# Patient Record
Sex: Male | Born: 1970 | Race: Asian | Hispanic: No | Marital: Married | Smoking: Never smoker
Health system: Southern US, Community
[De-identification: ages and names within clinical notes are randomized; demographics above are authoritative.]

## PROBLEM LIST (undated history)

## (undated) DIAGNOSIS — M109 Gout, unspecified: Secondary | ICD-10-CM

---

## 2008-11-03 ENCOUNTER — Emergency Department (HOSPITAL_COMMUNITY): Admission: EM | Admit: 2008-11-03 | Discharge: 2008-11-03 | Payer: Self-pay | Admitting: Family Medicine

## 2014-11-20 ENCOUNTER — Emergency Department (INDEPENDENT_AMBULATORY_CARE_PROVIDER_SITE_OTHER)
Admission: EM | Admit: 2014-11-20 | Discharge: 2014-11-20 | Disposition: A | Discharge status: Home / Self Care | Payer: Self-pay | Source: Home / Self Care | Attending: Family Medicine | Admitting: Family Medicine

## 2014-11-20 ENCOUNTER — Encounter (HOSPITAL_COMMUNITY): Payer: Self-pay | Admitting: Emergency Medicine

## 2014-11-20 DIAGNOSIS — J069 Acute upper respiratory infection, unspecified: Secondary | ICD-10-CM

## 2014-11-20 DIAGNOSIS — B9789 Other viral agents as the cause of diseases classified elsewhere: Principal | ICD-10-CM

## 2014-11-20 MED ORDER — IPRATROPIUM BROMIDE 0.06 % NA SOLN: 2.0000 | Freq: Four times a day (QID) | NASAL | Status: DC

## 2014-11-20 MED ORDER — IBUPROFEN 600 MG PO TABS: 600.0000 mg | ORAL_TABLET | Freq: Four times a day (QID) | ORAL | Status: DC | PRN

## 2014-11-20 MED ORDER — GUAIFENESIN-CODEINE 100-10 MG/5ML PO SOLN: 5.0000 mL | Freq: Four times a day (QID) | ORAL | Status: DC | PRN

## 2014-11-20 NOTE — Discharge Instructions (Signed)
Please take your ibuprofen and use the nasal atrovent for your symptoms Please use the cough medicine to help you sleep at night THis will take another 1-4 days to improve

## 2014-11-20 NOTE — ED Provider Notes (Signed)
CSN: [id number]     Arrival date & time 11/20/14  1638 History   First MD Initiated Contact with Patient 11/20/14 1142     Chief Complaint  Patient presents with  . Cough   (Consider location/radiation/quality/duration/timing/severity/associated sxs/prior Treatment) HPI   Cough adn HA: started 3 days ago. Associated w/ runny nose and intermittent chills. Tylenol w/o benefit. Symptoms are worse at night. No aggravating factors. Overall symptoms are getting worse. Denies sore throat, chest pain, palpitations, shortness of breath, nausea, vomiting, diarrhea, constipation, abdominal pain, back pain.  History reviewed. No pertinent past medical history. History reviewed. No pertinent past surgical history. Family History  Problem Relation Age of Onset  . Diabetes Neg Hx   . Heart failure Neg Hx   . Cancer Neg Hx   . Hyperlipidemia Neg Hx   . Hypertension Neg Hx    History  Substance Use Topics  . Smoking status: Never Smoker   . Smokeless tobacco: Not on file  . Alcohol Use: Yes     Comment: occasional    Review of Systems Per HPI with all other pertinent systems negative.   Allergies  Review of patient's allergies indicates no known allergies.  Home Medications   Prior to Admission medications   Not on File   BP 117/82 mmHg  Pulse 100  Temp(Src) 98.3 F (36.8 C) (Oral)  Resp 20  SpO2 97% Physical Exam Physical Exam  Constitutional: oriented to person, place, and time. appears well-developed and well-nourished. No distress.  HENT:  Head: Normocephalic and atraumatic.  Eyes: EOMI. PERRL.  Neck: Normal range of motion.  Cardiovascular: RRR, no m/r/g, 2+ distal pulses,  Pulmonary/Chest: Effort normal and breath sounds normal. No respiratory distress.  Abdominal: Soft. Bowel sounds are normal. NonTTP, no distension.  Musculoskeletal: Normal range of motion. Non ttp, no effusion.  Neurological: alert and oriented to person, place, and time.  Skin: Skin is warm. No  rash noted. non diaphoretic.  Psychiatric: normal mood and affect. behavior is normal. Judgment and thought content normal.   ED Course  Procedures (including critical care time) Labs Review Labs Reviewed - No data to display  Imaging Review No results found.   MDM   1. Viral URI with cough    Nasal atrovent, ibuprofen, robitussin AC Precautions given adn all questions answered  Linna Darner, MD Family Medicine 11/20/2014, 11:51 AM      Waldemar Dickens, MD 11/20/14 1151

## 2014-11-20 NOTE — ED Notes (Signed)
Patient c/o cough and headache x 3 days. Patient reports he has been taking Tylenol with no relief. He is unsure of fever, but has felt warm. Patient is in NAD.

## 2017-03-28 ENCOUNTER — Encounter (HOSPITAL_COMMUNITY): Payer: Self-pay | Admitting: Emergency Medicine

## 2017-03-28 ENCOUNTER — Ambulatory Visit (HOSPITAL_COMMUNITY)
Admission: EM | Admit: 2017-03-28 | Discharge: 2017-03-28 | Disposition: A | Discharge status: Home / Self Care | Payer: Medicaid Other | Attending: Emergency Medicine | Admitting: Emergency Medicine

## 2017-03-28 DIAGNOSIS — R066 Hiccough: Secondary | ICD-10-CM

## 2017-03-28 MED ORDER — CHLORPROMAZINE HCL 25 MG PO TABS: 25.0000 mg | ORAL_TABLET | Freq: Three times a day (TID) | ORAL | 0 refills | Status: DC

## 2017-03-28 NOTE — ED Provider Notes (Signed)
  [place]   [id number] 03/28/17 Arrival Time: 1627   SUBJECTIVE:  Jebediah Macrae is a 46 y.o. male who presents to the urgent care with complaint of hiccups that it been constant for 2 weeks, and interfering with sleep. States he quite painful and uncomfortable. He is on no new medications, and has not had symptoms like this before. He has no chest pain or palpitations, no weakness or dizziness, nausea or vomiting. Does not smoke or drink, and history is otherwise negative  ROS: As per HPI, remainder of ROS negative.   OBJECTIVE:  Vitals:   03/28/17 1723  BP: (!) 135/95  Pulse: 80  Resp: 16  Temp: 98.3 F (36.8 C)  TempSrc: Oral  SpO2: 97%  Weight: 165 lb (74.8 kg)     General appearance: alert; no distress HEENT: normocephalic; atraumatic; conjunctivae normal; Oropharynx clear without erythema or edema, no tonsillar exudate Neck: Trachea midline without JVD or cervical lymphadenopathy Lungs: clear to auscultation bilaterally Heart: regular rate and rhythm Abdomen: soft, non-tender; bowel sounds normal; no masses or organomegaly; no guarding or rebound tenderness Musculoskeletal: Grossly symmetrical Skin: warm and dry Neurologic: Grossly normal Psychological:  alert and cooperative; normal mood and affect     ASSESSMENT & PLAN:  1. Intractable hiccups     Meds ordered this encounter  Medications  . chlorproMAZINE (THORAZINE) 25 MG tablet    Sig: Take 1 tablet (25 mg total) by mouth 3 (three) times daily. May increase to 2 tablets, by mouth, 3 times a day after 3 days if no relief    Dispense:  20 tablet    Refill:  0    Order Specific Question:   Supervising Provider    Answer:   [place], [place]   Recommend following up with community health and wellness to establish for primary care, given counseling regarding the diagnosis and medications. Return as needed or go to the ER if symptoms worsen  Reviewed expectations re: course of current  medical issues. Questions answered. Outlined signs and symptoms indicating need for more acute intervention. Patient verbalized understanding. After Visit Summary given.    Procedures:     No results found for this or any previous visit.  Labs Reviewed - No data to display  No results found.  No Known Allergies  PMHx, SurgHx, SocialHx, Medications, and Allergies were reviewed in the Visit Navigator and updated as appropriate.       Barnet Glasgow, NP 03/28/17 [phone]

## 2017-03-28 NOTE — ED Triage Notes (Signed)
PT reports hiccups for 2 weeks with no relief

## 2017-03-28 NOTE — Discharge Instructions (Signed)
I have attached a handout with regard to your diagnosis, and started you on a medicine called Thorazine. Take one tablet 3 times a day, if you've had no relief in 3 days, you can increase this to 2 tablets 3 times a day. I have also included the contact information for primary care provider, contact them to set up an appointment to establish for primary care.

## 2017-06-05 ENCOUNTER — Ambulatory Visit (HOSPITAL_COMMUNITY)
Admission: EM | Admit: 2017-06-05 | Discharge: 2017-06-05 | Disposition: A | Discharge status: Home / Self Care | Payer: Medicaid Other | Attending: Family Medicine | Admitting: Family Medicine

## 2017-06-05 ENCOUNTER — Encounter (HOSPITAL_COMMUNITY): Payer: Self-pay | Admitting: Emergency Medicine

## 2017-06-05 DIAGNOSIS — R21 Rash and other nonspecific skin eruption: Secondary | ICD-10-CM | POA: Diagnosis not present

## 2017-06-05 DIAGNOSIS — L237 Allergic contact dermatitis due to plants, except food: Secondary | ICD-10-CM

## 2017-06-05 MED ORDER — PREDNISONE 50 MG PO TABS: ORAL_TABLET | ORAL | 0 refills | Status: DC

## 2017-06-05 MED ORDER — TRIAMCINOLONE ACETONIDE 40 MG/ML IJ SUSP
INTRAMUSCULAR | Status: AC
  Filled 2017-06-05: qty 1

## 2017-06-05 MED ORDER — TRIAMCINOLONE ACETONIDE 40 MG/ML IJ SUSP
40.0000 mg | Freq: Once | INTRAMUSCULAR | Status: AC
  Administered 2017-06-05: 40 mg via INTRAMUSCULAR

## 2017-06-05 MED ORDER — TRIAMCINOLONE ACETONIDE 0.1 % EX CREA: 1.0000 "application " | TOPICAL_CREAM | Freq: Two times a day (BID) | CUTANEOUS | 0 refills | Status: DC

## 2017-06-05 NOTE — ED Provider Notes (Signed)
[place]    CSN: [id number] Arrival date & time: 06/05/17  1001     History   Chief Complaint Chief Complaint  Patient presents with  . Rash    HPI Larry Jenkins is a 46 y.o. male.   46 year old male was working in the yard 3-4 days ago chopping brush cleaning brush and handling brush. This was followed by a rough red itchy rash to primarily the extremities and less to the torso.      History reviewed. No pertinent past medical history.  There are no active problems to display for this patient.   History reviewed. No pertinent surgical history.     Home Medications    Prior to Admission medications   Medication Sig Start Date End Date Taking? Authorizing Provider  chlorproMAZINE (THORAZINE) 25 MG tablet Take 1 tablet (25 mg total) by mouth 3 (three) times daily. May increase to 2 tablets, by mouth, 3 times a day after 3 days if no relief 03/28/17   Barnet Glasgow, NP  guaiFENesin-codeine 100-10 MG/5ML syrup Take 5-10 mLs by mouth every 6 (six) hours as needed for cough. 11/20/14   Waldemar Dickens, MD  ibuprofen (ADVIL,MOTRIN) 600 MG tablet Take 1 tablet (600 mg total) by mouth every 6 (six) hours as needed. 11/20/14   Waldemar Dickens, MD  ipratropium (ATROVENT) 0.06 % nasal spray Place 2 sprays into both nostrils 4 (four) times daily. 11/20/14   Waldemar Dickens, MD  predniSONE (DELTASONE) 50 MG tablet 1 tab po daily for 6 days. Take with food. 06/05/17   Janne Napoleon, NP  triamcinolone cream (KENALOG) 0.1 % Apply 1 application 2 (two) times daily topically. 06/05/17   Janne Napoleon, NP    Family History Family History  Problem Relation Age of Onset  . Diabetes Neg Hx   . Heart failure Neg Hx   . Cancer Neg Hx   . Hyperlipidemia Neg Hx   . Hypertension Neg Hx     Social History Social History   Tobacco Use  . Smoking status: Never Smoker  Substance Use Topics  . Alcohol use: Yes    Comment: occasional  . Drug use: No     Allergies     Patient has no known allergies.   Review of Systems Review of Systems  Constitutional: Negative.   HENT: Negative.   Respiratory: Negative.   Skin: Positive for rash.  All other systems reviewed and are negative.    Physical Exam Triage Vital Signs ED Triage Vitals [06/05/17 1038]  Enc Vitals Group     BP 130/90     Pulse Rate 95     Resp 18     Temp 98.8 F (37.1 C)     Temp Source Oral     SpO2 98 %     Weight      Height      Head Circumference      Peak Flow      Pain Score      Pain Loc      Pain Edu?      Excl. in [place]?    No data found.  Updated Vital Signs BP 130/90 (BP Location: Right Arm)   Pulse 95   Temp 98.8 F (37.1 C) (Oral)   Resp 18   SpO2 98%   Visual Acuity Right Eye Distance:   Left Eye Distance:   Bilateral Distance:    Right Eye Near:   Left Eye Near:  Bilateral Near:     Physical Exam  Constitutional: He is oriented to person, place, and time. He appears well-developed and well-nourished. No distress.  Eyes: EOM are normal.  Neck: Normal range of motion. Neck supple.  Cardiovascular: Normal rate.  Pulmonary/Chest: Effort normal. No respiratory distress.  Musculoskeletal: He exhibits no edema.  Neurological: He is alert and oriented to person, place, and time. He exhibits normal muscle tone.  Skin: Skin is warm and dry.  Red patchy papular and blotchy rash to the legs and upper extremities, lesser to the abdomen.  Psychiatric: He has a normal mood and affect.  Nursing note and vitals reviewed.    UC Treatments / Results  Labs (all labs ordered are listed, but only abnormal results are displayed) Labs Reviewed - No data to display  EKG  EKG Interpretation None       Radiology No results found.  Procedures Procedures (including critical care time)  Medications Ordered in UC Medications  triamcinolone acetonide (KENALOG-40) injection 40 mg (not administered)     Initial Impression / Assessment and Plan /  UC Course  I have reviewed the triage vital signs and the nursing notes.  Pertinent labs & imaging results that were available during my care of the patient were reviewed by me and considered in my medical decision making (see chart for details).       Final Clinical Impressions(s) / UC Diagnoses   Final diagnoses:  Allergic contact dermatitis due to plants, except food    New Prescriptions This SmartLink is deprecated. Use AVSMEDLIST instead to display the medication list for a patient.   Controlled Substance Prescriptions  Controlled Substance Registry consulted? Not Applicable   Janne Napoleon, NP 06/05/17 1127

## 2017-06-05 NOTE — ED Triage Notes (Signed)
Pt here for itchy rash from poison ivy; pt used OTC meds without success

## 2017-08-30 ENCOUNTER — Other Ambulatory Visit: Payer: Self-pay

## 2017-08-30 ENCOUNTER — Ambulatory Visit (HOSPITAL_COMMUNITY)
Admission: EM | Admit: 2017-08-30 | Discharge: 2017-08-30 | Disposition: A | Discharge status: Home / Self Care | Payer: Medicaid Other | Attending: Family Medicine | Admitting: Family Medicine

## 2017-08-30 ENCOUNTER — Ambulatory Visit (INDEPENDENT_AMBULATORY_CARE_PROVIDER_SITE_OTHER): Payer: Medicaid Other

## 2017-08-30 ENCOUNTER — Encounter (HOSPITAL_COMMUNITY): Payer: Self-pay | Admitting: Emergency Medicine

## 2017-08-30 DIAGNOSIS — M25511 Pain in right shoulder: Secondary | ICD-10-CM

## 2017-08-30 DIAGNOSIS — M7580 Other shoulder lesions, unspecified shoulder: Secondary | ICD-10-CM

## 2017-08-30 MED ORDER — KETOROLAC TROMETHAMINE 30 MG/ML IJ SOLN
30.0000 mg | Freq: Once | INTRAMUSCULAR | Status: AC
  Administered 2017-08-30: 30 mg via INTRAMUSCULAR

## 2017-08-30 MED ORDER — KETOROLAC TROMETHAMINE 30 MG/ML IJ SOLN
INTRAMUSCULAR | Status: AC
  Filled 2017-08-30: qty 1

## 2017-08-30 NOTE — ED Triage Notes (Signed)
Pt reports RUA pain for the last three days.  Pt is unable to lift his forearm with out help.  Pt appears to have swelling in the right hand.  He points to the deltoid area as the primary source of pain.  He denies any injury.

## 2017-08-31 NOTE — ED Provider Notes (Signed)
  [place]   [id number] 08/30/17 Arrival Time: 4270  ASSESSMENT & PLAN:  1. Acute pain of right shoulder   2. Rotator cuff tendinitis, unspecified laterality     Meds ordered this encounter  Medications  . ketorolac (TORADOL) 30 MG/ML injection 30 mg   Take Motrin 800mg  po tid  Reviewed expectations re: course of current medical issues. Questions answered. Outlined signs and symptoms indicating need for more acute intervention. Patient verbalized understanding. After Visit Summary given.   SUBJECTIVE: History from: patient. Larry Jenkins is a 47 y.o. male who presents with complaint of persistent right shoulder pain for 2 days.  He denies any trauma. Reports abrupt onset today. Described symptoms have gradually worsened since beginning.  ROS: As per HPI.   OBJECTIVE:  Vitals:   08/30/17 1749  BP: 132/82  Pulse: 80  Temp: 98 F (36.7 C)  TempSrc: Oral  SpO2: 98%    General appearance: alert; no distress Eyes: PERRLA; EOMI; conjunctiva normal HENT: normocephalic; atraumatic; TMs normal; nasal mucosa normal; oral mucosa normal Neck: supple  Lungs: clear to auscultation bilaterally Heart: regular rate and rhythm Abdomen: soft, non-tender; bowel sounds normal; no masses or organomegaly; no guarding or rebound tenderness Back: no CVA tenderness Extremities: Decreased ROM right shoulder with abduction, external, and internal rotation. Pain with internal and external rotation. Skin: warm and dry Neurologic: normal gait; normal symmetric reflexes Psychological: alert and cooperative; normal mood and affect  Labs: No results found for this or any previous visit. Labs Reviewed - No data to display  Imaging: Dg Shoulder Right  Result Date: 08/30/2017 CLINICAL DATA:  Acute pain, swelling EXAM: RIGHT SHOULDER - 2+ VIEW COMPARISON:  None. FINDINGS: Soft tissue calcification near the insertion of the rotator cuff may reflect chronic tendinitis. No fracture,  subluxation or dislocation. Joint spaces maintained. 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 No acute bony abnormality. Electronically Signed   By: Rolm Baptise M.D.   On: 08/30/2017 18:22    No Known Allergies  History reviewed. No pertinent past medical history. Social History   Socioeconomic History  . Marital status: Married    Spouse name: Not on file  . Number of children: Not on file  . Years of education: Not on file  . Highest education level: Not on file  Social Needs  . Financial resource strain: Not on file  . Food insecurity - worry: Not on file  . Food insecurity - inability: Not on file  . Transportation needs - medical: Not on file  . Transportation needs - non-medical: Not on file  Occupational History  . Not on file  Tobacco Use  . Smoking status: Never Smoker  . Smokeless tobacco: Never Used  Substance and Sexual Activity  . Alcohol use: Yes    Comment: occasional  . Drug use: No  . Sexual activity: Not on file  Other Topics Concern  . Not on file  Social History Narrative  . Not on file   Family History  Problem Relation Age of Onset  . Diabetes Neg Hx   . Heart failure Neg Hx   . Cancer Neg Hx   . Hyperlipidemia Neg Hx   . Hypertension Neg Hx    History reviewed. No pertinent surgical history.   Larry Jenkins, [place] 08/31/17 1142

## 2017-09-26 ENCOUNTER — Ambulatory Visit (HOSPITAL_COMMUNITY)
Admission: EM | Admit: 2017-09-26 | Discharge: 2017-09-26 | Disposition: A | Discharge status: Home / Self Care | Payer: Medicaid Other | Attending: Family Medicine | Admitting: Family Medicine

## 2017-09-26 ENCOUNTER — Encounter (HOSPITAL_COMMUNITY): Payer: Self-pay | Admitting: Emergency Medicine

## 2017-09-26 DIAGNOSIS — R51 Headache: Secondary | ICD-10-CM | POA: Insufficient documentation

## 2017-09-26 DIAGNOSIS — B9789 Other viral agents as the cause of diseases classified elsewhere: Secondary | ICD-10-CM

## 2017-09-26 DIAGNOSIS — R05 Cough: Secondary | ICD-10-CM | POA: Diagnosis present

## 2017-09-26 DIAGNOSIS — Z79899 Other long term (current) drug therapy: Secondary | ICD-10-CM | POA: Insufficient documentation

## 2017-09-26 DIAGNOSIS — J029 Acute pharyngitis, unspecified: Secondary | ICD-10-CM | POA: Diagnosis present

## 2017-09-26 DIAGNOSIS — J069 Acute upper respiratory infection, unspecified: Secondary | ICD-10-CM | POA: Diagnosis not present

## 2017-09-26 LAB — POCT RAPID STREP A: Streptococcus, Group A Screen (Direct): NEGATIVE

## 2017-09-26 MED ORDER — CETIRIZINE HCL 10 MG PO CAPS: 10.0000 mg | ORAL_CAPSULE | Freq: Every day | ORAL | 0 refills | Status: DC

## 2017-09-26 MED ORDER — BENZONATATE 200 MG PO CAPS: 200.0000 mg | ORAL_CAPSULE | Freq: Three times a day (TID) | ORAL | 0 refills | Status: AC | PRN

## 2017-09-26 MED ORDER — FLUTICASONE PROPIONATE 50 MCG/ACT NA SUSP: 1.0000 | Freq: Every day | NASAL | 0 refills | Status: DC

## 2017-09-26 MED ORDER — KETOROLAC TROMETHAMINE 60 MG/2ML IM SOLN
60.0000 mg | Freq: Once | INTRAMUSCULAR | Status: AC
  Administered 2017-09-26: 60 mg via INTRAMUSCULAR

## 2017-09-26 MED ORDER — KETOROLAC TROMETHAMINE 60 MG/2ML IM SOLN
INTRAMUSCULAR | Status: AC
  Filled 2017-09-26: qty 2

## 2017-09-26 MED ORDER — HYDROCODONE-HOMATROPINE 5-1.5 MG/5ML PO SYRP: 5.0000 mL | ORAL_SOLUTION | Freq: Four times a day (QID) | ORAL | 0 refills | Status: AC | PRN

## 2017-09-26 NOTE — ED Provider Notes (Addendum)
[place]    CSN: [id number] Arrival date & time: 09/26/17  1403     History   Chief Complaint Chief Complaint  Patient presents with  . Headache  . Cough    HPI Larry Jenkins is a 47 y.o. male no contributing past medical history, Patient is presenting with URI symptoms- congestion, cough, sore throat.  Patient also with headache.  Patient's main complaints are cough preventing him from sleeping. Symptoms have been going on for 3-4 days. Patient has tried Tylenol and Aleve for his headache, with minimal relief. Denies fever, nausea, vomiting, diarrhea. Denies shortness of breath and chest pain.  Denies history of smoking.   HPI  History reviewed. No pertinent past medical history.  There are no active problems to display for this patient.   History reviewed. No pertinent surgical history.     Home Medications    Prior to Admission medications   Medication Sig Start Date End Date Taking? Authorizing Provider  benzonatate (TESSALON) 200 MG capsule Take 1 capsule (200 mg total) by mouth 3 (three) times daily as needed for up to 7 days for cough. 09/26/17 10/03/17  Ryin Schillo C, PA-C  Cetirizine HCl 10 MG CAPS Take 1 capsule (10 mg total) by mouth daily for 10 days. 09/26/17 10/06/17  Dequavion Follette C, PA-C  chlorproMAZINE (THORAZINE) 25 MG tablet Take 1 tablet (25 mg total) by mouth 3 (three) times daily. May increase to 2 tablets, by mouth, 3 times a day after 3 days if no relief 03/28/17   Barnet Glasgow, NP  fluticasone [facility]) 50 MCG/ACT nasal spray Place 1-2 sprays into both nostrils daily. 09/26/17   Terrye Dombrosky C, PA-C  guaiFENesin-codeine 100-10 MG/5ML syrup Take 5-10 mLs by mouth every 6 (six) hours as needed for cough. 11/20/14   Waldemar Dickens, MD  HYDROcodone-homatropine [facility]) 5-1.5 MG/5ML syrup Take 5 mLs by mouth every 6 (six) hours as needed for up to 5 days for cough. 09/26/17 10/01/17  Eydan Chianese C, PA-C  ibuprofen (ADVIL,MOTRIN) 600  MG tablet Take 1 tablet (600 mg total) by mouth every 6 (six) hours as needed. 11/20/14   Waldemar Dickens, MD  ipratropium (ATROVENT) 0.06 % nasal spray Place 2 sprays into both nostrils 4 (four) times daily. 11/20/14   Waldemar Dickens, MD  triamcinolone cream (KENALOG) 0.1 % Apply 1 application 2 (two) times daily topically. 06/05/17   Janne Napoleon, NP    Family History Family History  Problem Relation Age of Onset  . Diabetes Neg Hx   . Heart failure Neg Hx   . Cancer Neg Hx   . Hyperlipidemia Neg Hx   . Hypertension Neg Hx     Social History Social History   Tobacco Use  . Smoking status: Never Smoker  . Smokeless tobacco: Never Used  Substance Use Topics  . Alcohol use: Yes    Comment: occasional  . Drug use: No     Allergies   Patient has no known allergies.   Review of Systems Review of Systems  Constitutional: Negative for activity change, appetite change, fatigue and fever.  HENT: Positive for congestion and rhinorrhea. Negative for ear pain, postnasal drip, sinus pressure and sore throat.   Eyes: Negative for pain and itching.  Respiratory: Positive for cough. Negative for shortness of breath.   Cardiovascular: Negative for chest pain.  Gastrointestinal: Negative for abdominal pain, diarrhea, nausea and vomiting.  Musculoskeletal: Negative for myalgias.  Skin: Negative for rash.  Neurological: Positive  for headaches. Negative for dizziness and light-headedness.     Physical Exam Triage Vital Signs ED Triage Vitals [09/26/17 1526]  Enc Vitals Group     BP 127/83     Pulse Rate 79     Resp 16     Temp 98.4 F (36.9 C)     Temp Source Oral     SpO2 99 %     Weight 165 lb (74.8 kg)     Height      Head Circumference      Peak Flow      Pain Score 8     Pain Loc      Pain Edu?      Excl. in [place]?    No data found.  Updated Vital Signs BP 127/83   Pulse 79   Temp 98.4 F (36.9 C) (Oral)   Resp 16   Wt 165 lb (74.8 kg)   SpO2 99%   Visual  Acuity Right Eye Distance:   Left Eye Distance:   Bilateral Distance:    Right Eye Near:   Left Eye Near:    Bilateral Near:     Physical Exam  Constitutional: He is oriented to person, place, and time. He appears well-developed and well-nourished.  HENT:  Head: Normocephalic and atraumatic.  Bilateral TMs nonerythematous, nasal mucosa erythematous with rhinorrhea present.  Posterior oropharynx erythematous, no tonsillar enlargement or exudate.  Eyes: Conjunctivae and EOM are normal. Pupils are equal, round, and reactive to light.  Neck: Neck supple.  Cardiovascular: Normal rate and regular rhythm.  No murmur heard. Pulmonary/Chest: Effort normal and breath sounds normal. No respiratory distress.  Breathing audibly at rest, CTA BL  Musculoskeletal: He exhibits no edema.  Neurological: He is alert and oriented to person, place, and time.  Skin: Skin is warm and dry.  Psychiatric: He has a normal mood and affect.  Nursing note and vitals reviewed.    UC Treatments / Results  Labs (all labs ordered are listed, but only abnormal results are displayed) Labs Reviewed  CULTURE, GROUP A STREP [facility])    EKG  EKG Interpretation None       Radiology No results found.  Procedures Procedures (including critical care time)  Medications Ordered in UC Medications  ketorolac (TORADOL) injection 60 mg (not administered)     Initial Impression / Assessment and Plan / UC Course  I have reviewed the triage vital signs and the nursing notes.  Pertinent labs & imaging results that were available during my care of the patient were reviewed by me and considered in my medical decision making (see chart for details).     Patient with symptoms likely from viral URI, with a headache.  Patient without fever today in clinic.  Will treat headache with Toradol.  Symptom control recommended.  Will provide Zyrtec, Flonase, Tessalon for cough during the day, Hycodan for cough at night.  Discussed strict return precautions. Patient verbalized understanding and is agreeable with plan.   Final Clinical Impressions(s) / UC Diagnoses   Final diagnoses:  Viral URI with cough    ED Discharge Orders        Ordered    Cetirizine HCl 10 MG CAPS  Daily     09/26/17 1621    fluticasone (FLONASE) 50 MCG/ACT nasal spray  Daily     09/26/17 1621    HYDROcodone-homatropine (HYCODAN) 5-1.5 MG/5ML syrup  Every 6 hours PRN     09/26/17 1621    benzonatate (TESSALON)  200 MG capsule  3 times daily PRN     09/26/17 1621       Controlled Substance Prescriptions [place] Controlled Substance Registry consulted? No   Janith Lima, PA-C 09/26/17 1629    Janith Lima, [place] 09/26/17 1634

## 2017-09-26 NOTE — ED Triage Notes (Signed)
PT reports cough and headache for 3-4 days.

## 2017-09-26 NOTE — Discharge Instructions (Signed)
We have given you an injection of Toradol today for your headache.  Please continue Aleve, Tylenol or ibuprofen at home.  Please return if headache worsening or not improving.  For congestion please begin Zyrtec daily and Flonase nasal spray daily.  For cough please use Tessalon during the day, you may use Hycodan at night.  This will cause sedation, do not drive after using.  I expect symptoms to persist for about 5 days followed by gradual improvement.  Please return if symptoms worsening, changing, or not improving in 1-2 weeks.

## 2017-09-29 LAB — CULTURE, GROUP A STREP (THRC)

## 2017-11-29 ENCOUNTER — Other Ambulatory Visit: Payer: Self-pay

## 2017-11-29 ENCOUNTER — Ambulatory Visit (HOSPITAL_COMMUNITY)
Admission: EM | Admit: 2017-11-29 | Discharge: 2017-11-29 | Disposition: A | Discharge status: Home / Self Care | Payer: Medicaid Other | Attending: Urgent Care | Admitting: Urgent Care

## 2017-11-29 ENCOUNTER — Encounter (HOSPITAL_COMMUNITY): Payer: Self-pay | Admitting: Emergency Medicine

## 2017-11-29 DIAGNOSIS — M79672 Pain in left foot: Secondary | ICD-10-CM

## 2017-11-29 DIAGNOSIS — M79675 Pain in left toe(s): Secondary | ICD-10-CM

## 2017-11-29 DIAGNOSIS — M10072 Idiopathic gout, left ankle and foot: Secondary | ICD-10-CM

## 2017-11-29 DIAGNOSIS — M109 Gout, unspecified: Secondary | ICD-10-CM | POA: Insufficient documentation

## 2017-11-29 DIAGNOSIS — M7989 Other specified soft tissue disorders: Secondary | ICD-10-CM

## 2017-11-29 HISTORY — DX: Gout, unspecified: M10.9

## 2017-11-29 LAB — URIC ACID: URIC ACID, SERUM: 6.4 mg/dL (ref 4.4–7.6)

## 2017-11-29 MED ORDER — PREDNISONE 20 MG PO TABS: ORAL_TABLET | ORAL | 0 refills | Status: DC

## 2017-11-29 NOTE — ED Provider Notes (Signed)
  MRN: [id number] DOB: 07-21-1971  Subjective:   Larry Jenkins is a 47 y.o. male presenting for 1 day history of severe left great toe pain, swelling and redness. Patient has a history of gout. States that his last episode was 1 year ago but cannot recall the name of the medication he used with good relief. States that he has 1-2 bottles of beer multiple times a week.  No Known Allergies  Past Medical History:  Diagnosis Date  . Gout     History reviewed. No pertinent surgical history.  Objective:   Vitals: BP 126/89 (BP Location: Left Arm)   Pulse 79   Temp 97.7 F (36.5 C) (Oral)   Resp 20   SpO2 100%   Physical Exam  Constitutional: He is oriented to person, place, and time. He appears well-developed and well-nourished.  Cardiovascular: Normal rate.  Pulmonary/Chest: Effort normal.  Musculoskeletal:       Feet:  Neurological: He is alert and oriented to person, place, and time.   Assessment and Plan :   Acute gout of left foot, unspecified cause  Pain and swelling of toe of left foot  Left foot pain  Will have patient start prednisone to address gout given severe pain. He denies history of diabetes. Counseled on avoidance of drinking as a source of his gout. Uric acid level pending.    Jaynee Eagles, PA-C 11/29/17 1352

## 2017-11-29 NOTE — ED Triage Notes (Signed)
Left foot pain, no known injury.  Patient says this is gout

## 2017-11-30 NOTE — Progress Notes (Signed)
Attempted to reach patient regarding normal results. No answer at this time.  

## 2017-12-24 ENCOUNTER — Other Ambulatory Visit: Payer: Self-pay | Admitting: Urgent Care

## 2017-12-25 ENCOUNTER — Encounter (HOSPITAL_COMMUNITY): Payer: Self-pay | Admitting: Family Medicine

## 2017-12-25 ENCOUNTER — Ambulatory Visit (HOSPITAL_COMMUNITY)
Admission: EM | Admit: 2017-12-25 | Discharge: 2017-12-25 | Disposition: A | Discharge status: Home / Self Care | Payer: Medicaid Other | Attending: Family Medicine | Admitting: Family Medicine

## 2017-12-25 DIAGNOSIS — M10072 Idiopathic gout, left ankle and foot: Secondary | ICD-10-CM

## 2017-12-25 DIAGNOSIS — M79675 Pain in left toe(s): Secondary | ICD-10-CM

## 2017-12-25 DIAGNOSIS — M109 Gout, unspecified: Secondary | ICD-10-CM

## 2017-12-25 MED ORDER — METHYLPREDNISOLONE SODIUM SUCC 125 MG IJ SOLR
INTRAMUSCULAR | Status: AC
  Filled 2017-12-25: qty 2

## 2017-12-25 MED ORDER — METHYLPREDNISOLONE SODIUM SUCC 125 MG IJ SOLR
80.0000 mg | Freq: Once | INTRAMUSCULAR | Status: AC
Start: 2017-12-25 — End: 2017-12-25
  Administered 2017-12-25: 80 mg via INTRAMUSCULAR

## 2017-12-25 NOTE — ED Triage Notes (Signed)
Pt here for left foot pain, swelling and redness. He was treated for gout a month ago and believes it has returned.

## 2017-12-25 NOTE — Discharge Instructions (Addendum)
Steroid shot given in office Rest, ice and elevate Follow up with PCP if symptoms persists Return or go to the ER if you have any new or worsening symptoms

## 2017-12-25 NOTE — ED Provider Notes (Signed)
[place]   [id number] 12/25/17 Arrival Time: 5809  SUBJECTIVE: History from: patient. Larry Jenkins is a 47 y.o. male complains of left great toe pain for the past 3 weeks.  Denies a precipitating event or specific injury.  Localizes the pain to the left great toe and top of foot.  Describes the pain as constant and throbbing in character.  Has NOT tried OTC medications without relief.  Denies aggravating symptoms.  Reports similar symptoms in the past and treated with 5 day course of oral prednisone for gout. Requests steroid shot today.  Complains of redness and swelling.  Denies fever, chills, erythema, ecchymosis, effusion, weakness, numbness and tingling.      ROS: As per HPI.  Past Medical History:  Diagnosis Date  . Gout    History reviewed. No pertinent surgical history. No Known Allergies No current facility-administered medications on file prior to encounter.    Current Outpatient Medications on File Prior to Encounter  Medication Sig Dispense Refill  . predniSONE (DELTASONE) 20 MG tablet Take 2 tablets daily with breakfast. 10 tablet 0   Social History   Socioeconomic History  . Marital status: Married    Spouse name: Not on file  . Number of children: Not on file  . Years of education: Not on file  . Highest education level: Not on file  Occupational History  . Not on file  Social Needs  . Financial resource strain: Not on file  . Food insecurity:    Worry: Not on file    Inability: Not on file  . Transportation needs:    Medical: Not on file    Non-medical: Not on file  Tobacco Use  . Smoking status: Never Smoker  . Smokeless tobacco: Never Used  Substance and Sexual Activity  . Alcohol use: Yes    Comment: occasional  . Drug use: No  . Sexual activity: Not on file  Lifestyle  . Physical activity:    Days per week: Not on file    Minutes per session: Not on file  . Stress: Not on file  Relationships  . Social connections:    Talks on  phone: Not on file    Gets together: Not on file    Attends religious service: Not on file    Active member of club or organization: Not on file    Attends meetings of clubs or organizations: Not on file    Relationship status: Not on file  . Intimate partner violence:    Fear of current or ex partner: Not on file    Emotionally abused: Not on file    Physically abused: Not on file    Forced sexual activity: Not on file  Other Topics Concern  . Not on file  Social History Narrative  . Not on file   Family History  Problem Relation Age of Onset  . Diabetes Neg Hx   . Heart failure Neg Hx   . Cancer Neg Hx   . Hyperlipidemia Neg Hx   . Hypertension Neg Hx     OBJECTIVE:  Vitals:   12/25/17 1106  BP: (!) 125/91  Pulse: 94  Resp: 18  Temp: 98.6 F (37 C)  SpO2: 100%    General appearance: AOx3; in no acute distress.  Head: NCAT Lungs: CTA bilaterally Heart: RRR.  Clear S1 and S2 without murmur, gallops, or rubs.  Radial pulses 2+ bilaterally. Musculoskeletal: LT Foot Inspection: Skin warm, dry, clear and intact.  Moderate  effusion and erythema over dorsal aspect of distal foot Palpation: Diffusely tender about the distal dorsum of the  left foot; exquisitely tender about the first MTP joint ROM: LROM CV: Dorsalis pedis pulse 2+; cap refill <2 secs Skin: warm and dry Neurologic: Antalgic gait Psychological: alert and cooperative; normal mood and affect  ASSESSMENT & PLAN:  1. Acute gout of left foot, unspecified cause    Meds ordered this encounter  Medications  . methylPREDNISolone sodium succinate (SOLU-MEDROL) 125 mg/2 mL injection 80 mg    Steroid shot given in office Rest, ice and elevate Follow up with PCP if symptoms persists Return or go to the ER if you have any new or worsening symptoms  Reviewed expectations re: course of current medical issues. Questions answered. Outlined signs and symptoms indicating need for more acute intervention. Patient  verbalized understanding. After Visit Summary given.    Lestine Box, PA-C 12/25/17 1153

## 2017-12-28 ENCOUNTER — Ambulatory Visit (HOSPITAL_COMMUNITY)
Admission: EM | Admit: 2017-12-28 | Discharge: 2017-12-28 | Disposition: A | Discharge status: Home / Self Care | Payer: Medicaid Other | Attending: Family Medicine | Admitting: Family Medicine

## 2017-12-28 ENCOUNTER — Encounter (HOSPITAL_COMMUNITY): Payer: Self-pay | Admitting: Emergency Medicine

## 2017-12-28 ENCOUNTER — Other Ambulatory Visit: Payer: Self-pay

## 2017-12-28 DIAGNOSIS — M79675 Pain in left toe(s): Secondary | ICD-10-CM

## 2017-12-28 MED ORDER — PREDNISONE 20 MG PO TABS: 40.0000 mg | ORAL_TABLET | Freq: Every day | ORAL | 0 refills | Status: AC

## 2017-12-28 NOTE — Discharge Instructions (Signed)
History and exam consistent with gout. Start prednisone as directed. Monitor food and drinks that can be exacerbating symptoms. I have attached information of foods and drinks to avoid. Please establish PCP care for further evaluation and management needed. If experiencing worsening symptoms, fevers, follow up for reevaluation.

## 2017-12-28 NOTE — ED Provider Notes (Signed)
[place]    CSN: [id number] Arrival date & time: 12/28/17  1257     History   Chief Complaint Chief Complaint  Patient presents with  . Foot Pain    left  . Follow-up    HPI Larry Jenkins is a 47 y.o. male.   47 year old male comes in for 2 day history of left great toe pain. He states he has had recurrent episodes that has been treated with prednisone with good relief. Denies injury/trauma. States area is swollen, tender to touch and erythematous. Denies fever, chills, night sweats. States he stopped drinking beer since he was first told about gout. He does eat red meat, but has not found a specific trigger to his gout attacks.      Past Medical History:  Diagnosis Date  . Gout     There are no active problems to display for this patient.   History reviewed. No pertinent surgical history.     Home Medications    Prior to Admission medications   Medication Sig Start Date End Date Taking? Authorizing Provider  predniSONE (DELTASONE) 20 MG tablet Take 2 tablets (40 mg total) by mouth daily with breakfast for 7 days. Take 2 tablets daily with breakfast. 12/28/17 01/04/18  Ok Edwards, PA-C    Family History Family History  Problem Relation Age of Onset  . Diabetes Neg Hx   . Heart failure Neg Hx   . Cancer Neg Hx   . Hyperlipidemia Neg Hx   . Hypertension Neg Hx     Social History Social History   Tobacco Use  . Smoking status: Never Smoker  . Smokeless tobacco: Never Used  Substance Use Topics  . Alcohol use: Yes    Comment: occasional  . Drug use: No     Allergies   Patient has no known allergies.   Review of Systems Review of Systems  Reason unable to perform ROS: See HPI as above.     Physical Exam Triage Vital Signs ED Triage Vitals  Enc Vitals Group     BP 12/28/17 1320 (!) 129/96     Pulse Rate 12/28/17 1320 76     Resp --      Temp 12/28/17 1320 98.1 F (36.7 C)     Temp Source 12/28/17 1320 Oral     SpO2 12/28/17  1320 97 %     Weight --      Height --      Head Circumference --      Peak Flow --      Pain Score 12/28/17 1322 0     Pain Loc --      Pain Edu? --      Excl. in [place]? --    No data found.  Updated Vital Signs BP (!) 129/96 (BP Location: Right Arm)   Pulse 76   Temp 98.1 F (36.7 C) (Oral)   SpO2 97%   Physical Exam  Constitutional: He is oriented to person, place, and time. He appears well-developed and well-nourished. No distress.  HENT:  Head: Normocephalic and atraumatic.  Eyes: Pupils are equal, round, and reactive to light. Conjunctivae are normal.  Musculoskeletal:  Left great toe MTP joint with swelling, erythema, increased warmth. Area is tender to palpation. Limited ROM due to swelling. Sensation intact and equal. Pedal pulse 2+, cap refill <2s  Neurological: He is alert and oriented to person, place, and time.    UC Treatments / Results  Labs (  all labs ordered are listed, but only abnormal results are displayed) Labs Reviewed - No data to display  EKG None  Radiology No results found.  Procedures Procedures (including critical care time)  Medications Ordered in UC Medications - No data to display  Initial Impression / Assessment and Plan / UC Course  I have reviewed the triage vital signs and the nursing notes.  Pertinent labs & imaging results that were available during my care of the patient were reviewed by me and considered in my medical decision making (see chart for details).    History and exam consistent with gout. Will start prednisone as directed. Low purine diet information provided. Patient to keep food diary when symptom onset. Discussed establishing PCP care for further evaluation and management given frequency of attack. Patient expresses understanding and agrees to plan.  Final Clinical Impressions(s) / UC Diagnoses   Final diagnoses:  Great toe pain, left    ED Prescriptions    Medication Sig Dispense Auth. Provider    predniSONE (DELTASONE) 20 MG tablet Take 2 tablets (40 mg total) by mouth daily with breakfast for 7 days. Take 2 tablets daily with breakfast. 14 tablet Tobin Chad, [place] 12/28/17 1413

## 2017-12-28 NOTE — ED Triage Notes (Signed)
Pt states he has been having left great toe and foot pain x1 month.  He states this is a recurring issue.  He was seen here twice before in the last month and given a Rx for Prednisone and a shot of Solu-Medrol.  He states this has not helped.  He states this is a recurrent issue and a different place gave him medicine for gout and it went away.

## 2019-06-01 IMAGING — DX DG SHOULDER 2+V*R*
3 series · 3 of 3 positions shown · non-contrast
Comparison: None.

CLINICAL DATA: Acute pain, swelling

EXAM:
RIGHT SHOULDER - 2+ VIEW

[shoulder ap]
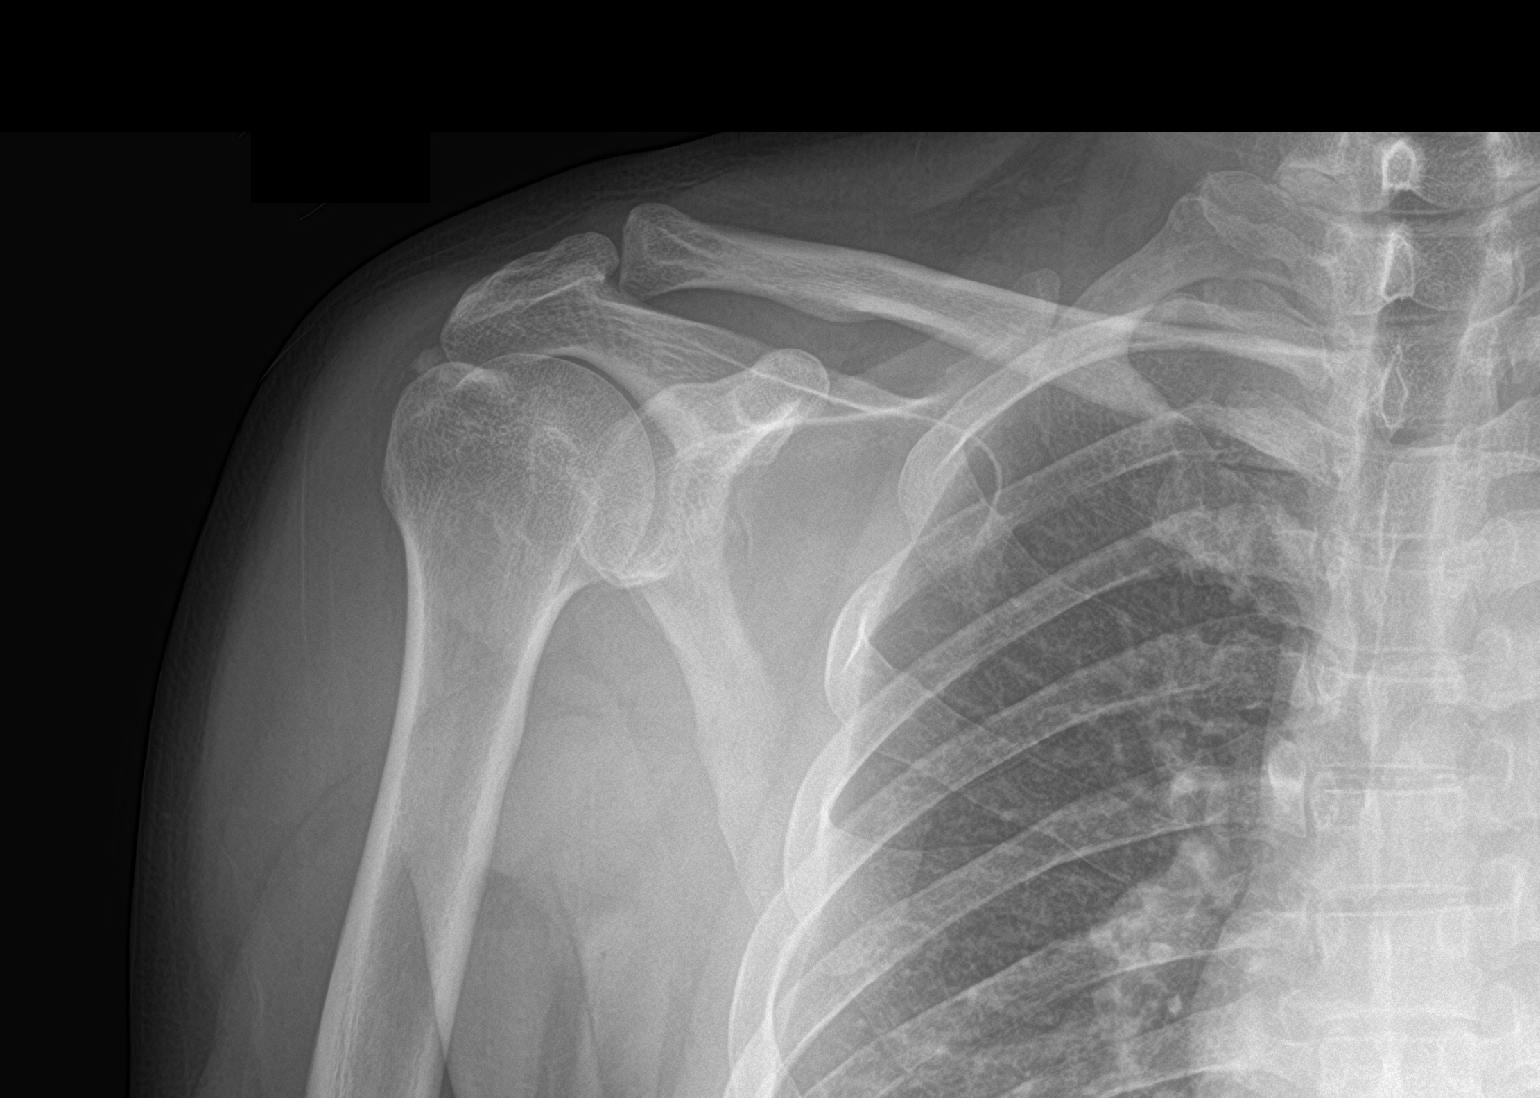

[shoulder grashey]
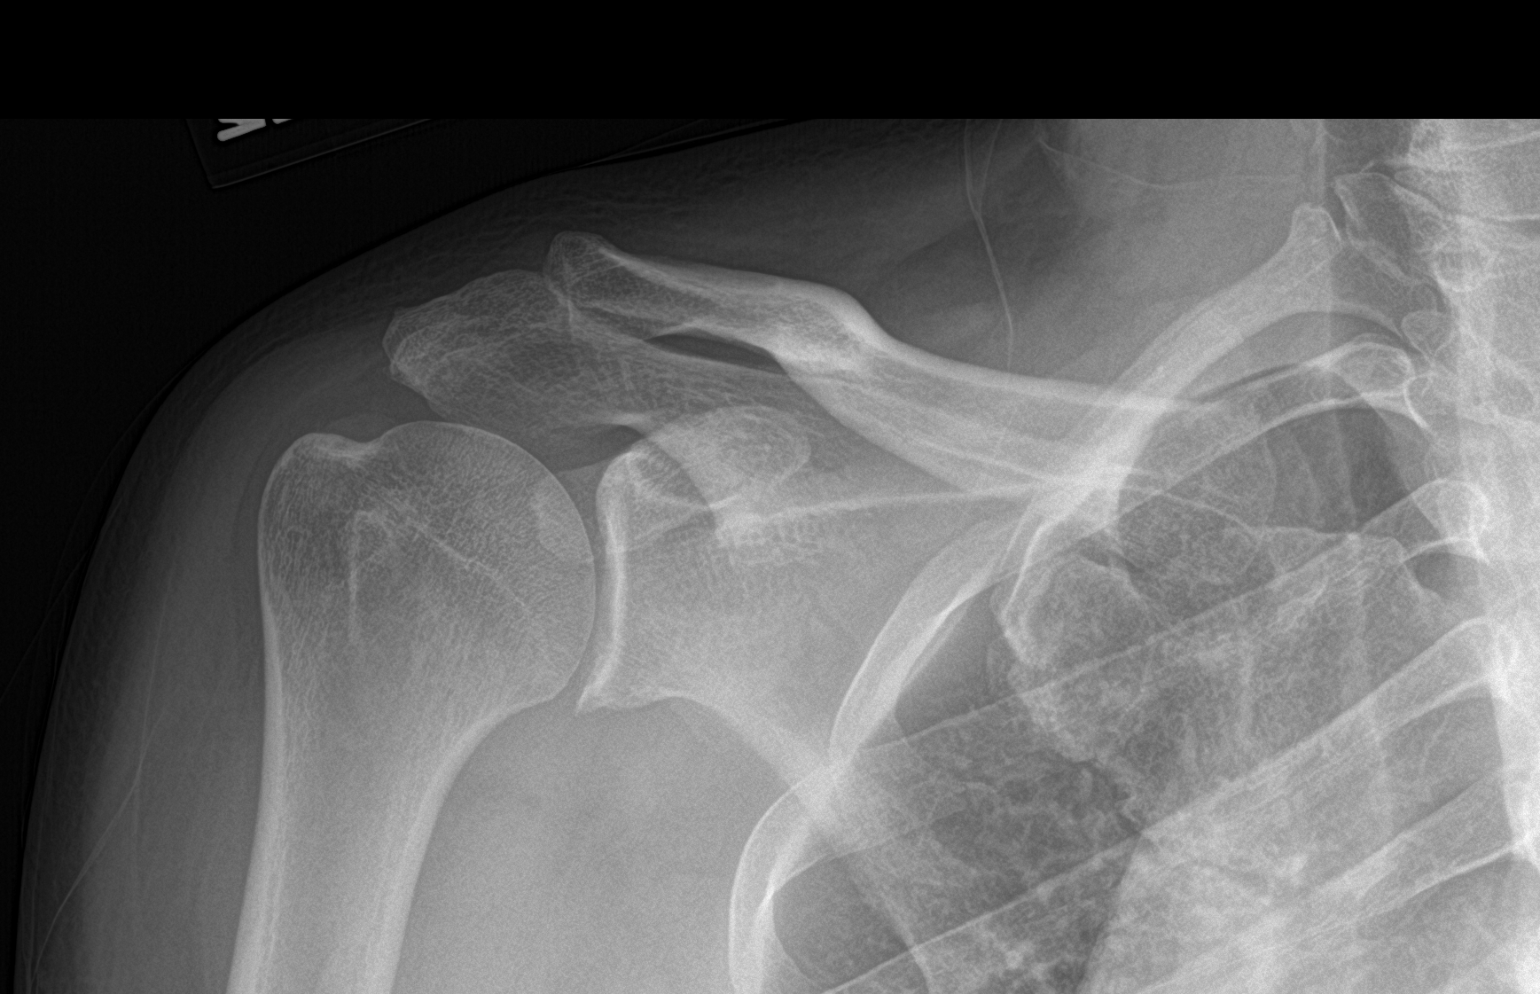

[shoulder y-view]
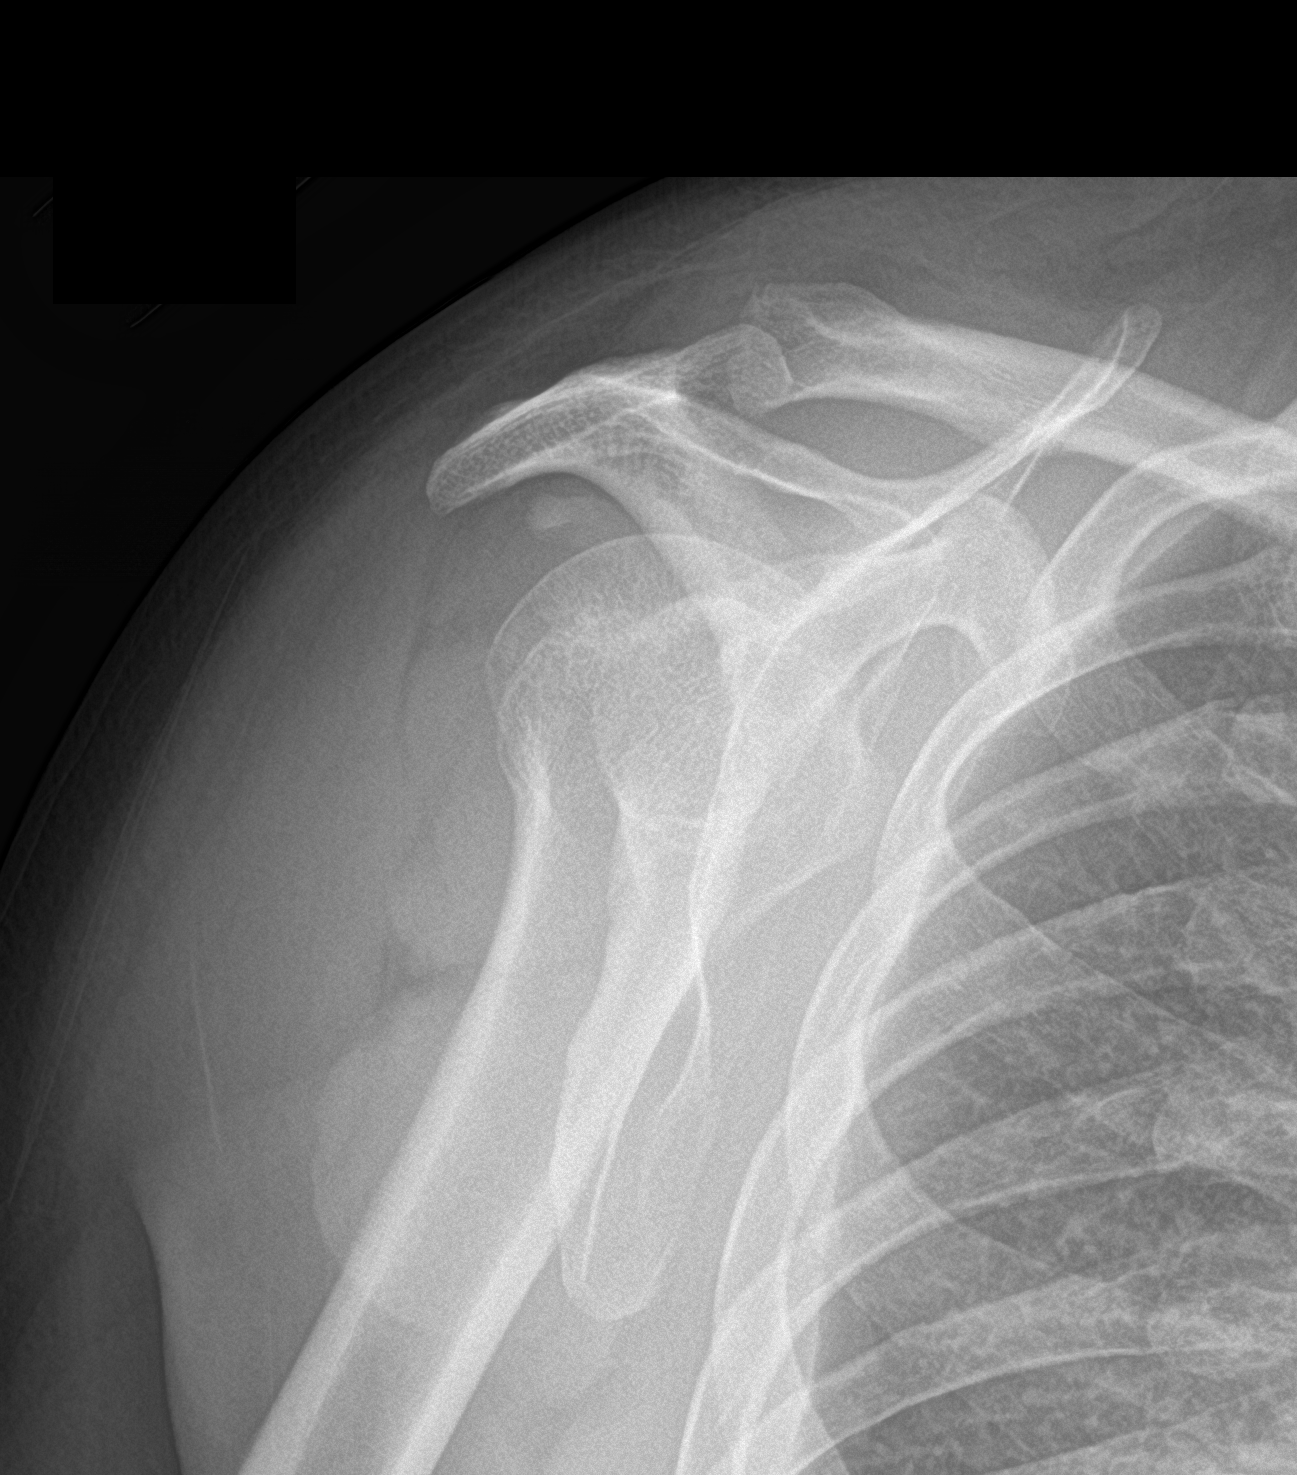

[3 of 3 positions shown; findings below may reference images not displayed]

FINDINGS: Soft tissue calcification near the insertion of the rotator cuff may
reflect chronic tendinitis. No fracture, subluxation or dislocation.
Joint spaces maintained.
IMPRESSION: Probable chronic tendinitis in the rotator cuff. No acute bony
abnormality.

## 2020-08-13 ENCOUNTER — Other Ambulatory Visit: Payer: Medicaid Other

## 2020-08-13 ENCOUNTER — Other Ambulatory Visit: Payer: Self-pay

## 2020-08-13 DIAGNOSIS — Z20822 Contact with and (suspected) exposure to covid-19: Secondary | ICD-10-CM

## 2020-08-15 LAB — SARS-COV-2, NAA 2 DAY TAT

## 2020-08-15 LAB — NOVEL CORONAVIRUS, NAA: SARS-CoV-2, NAA: DETECTED — AB

## 2021-02-09 ENCOUNTER — Ambulatory Visit (HOSPITAL_COMMUNITY)
Admission: EM | Admit: 2021-02-09 | Discharge: 2021-02-09 | Disposition: A | Discharge status: Home / Self Care | Payer: Self-pay | Attending: Family Medicine | Admitting: Family Medicine

## 2021-02-09 ENCOUNTER — Other Ambulatory Visit: Payer: Self-pay

## 2021-02-09 ENCOUNTER — Encounter (HOSPITAL_COMMUNITY): Payer: Self-pay

## 2021-02-09 DIAGNOSIS — H6983 Other specified disorders of Eustachian tube, bilateral: Secondary | ICD-10-CM

## 2021-02-09 MED ORDER — PREDNISONE 50 MG PO TABS: ORAL_TABLET | ORAL | 0 refills | Status: DC

## 2021-02-09 MED ORDER — FLUTICASONE PROPIONATE 50 MCG/ACT NA SUSP: 1.0000 | Freq: Two times a day (BID) | NASAL | 0 refills | Status: DC | PRN

## 2021-02-09 NOTE — ED Provider Notes (Signed)
[place]    CSN: [id number] Arrival date & time: 02/09/21  1356      History   Chief Complaint Chief Complaint  Patient presents with   Ear Pain    HPI Larry Jenkins is a 50 y.o. male.   Patient declines a medical interpreter today.  Wishes to have assistance of his girlfriend who speaks English very well when he is having difficulty understanding though understands and communicates fairly well throughout the visit.  Patient presenting today with 2-day history of bilateral ear pain after getting shower water in them.  The pain is sharp, intermittent and he denies any muffled hearing, drainage, fever, chills, congestion.  Also having some sinus pressure, headaches off and on.  Not trying anything over-the-counter for symptoms other than Tylenol which he states does not help.  No past history of chronic ear issues   Past Medical History:  Diagnosis Date   Gout     There are no problems to display for this patient.   History reviewed. No pertinent surgical history.     Home Medications    Prior to Admission medications   Medication Sig Start Date End Date Taking? Authorizing Provider  fluticasone (FLONASE) 50 MCG/ACT nasal spray Place 1 spray into both nostrils 2 (two) times daily as needed for allergies or rhinitis. 02/09/21  Yes Volney American, PA-C  predniSONE (DELTASONE) 50 MG tablet Take 1 tab daily with breakfast for 3 days 02/09/21  Yes Volney American, PA-C    Family History Family History  Problem Relation Age of Onset   Diabetes Neg Hx    Heart failure Neg Hx    Cancer Neg Hx    Hyperlipidemia Neg Hx    Hypertension Neg Hx     Social History Social History   Tobacco Use   Smoking status: Never   Smokeless tobacco: Never  Substance Use Topics   Alcohol use: Yes    Comment: occasional   Drug use: No     Allergies   Patient has no known allergies.   Review of Systems Review of Systems Per HPI  Physical Exam Triage  Vital Signs ED Triage Vitals  Enc Vitals Group     BP 02/09/21 1540 114/87     Pulse Rate 02/09/21 1540 75     Resp 02/09/21 1540 16     Temp 02/09/21 1540 97.6 F (36.4 C)     Temp Source 02/09/21 1540 Oral     SpO2 02/09/21 1540 99 %     Weight --      Height --      Head Circumference --      Peak Flow --      Pain Score 02/09/21 1538 6     Pain Loc --      Pain Edu? --      Excl. in [place]? --    No data found.  Updated Vital Signs BP 114/87 (BP Location: Right Arm)   Pulse 75   Temp 97.6 F (36.4 C) (Oral)   Resp 16   SpO2 99%   Visual Acuity Right Eye Distance:   Left Eye Distance:   Bilateral Distance:    Right Eye Near:   Left Eye Near:    Bilateral Near:     Physical Exam Vitals and nursing note reviewed.  Constitutional:      Appearance: Normal appearance.  HENT:     Head: Atraumatic.     Ears:  Comments: Mild bilateral middle ear effusions.  No evidence of otitis media or otitis externa    Nose: Nose normal.     Mouth/Throat:     Mouth: Mucous membranes are moist.  Eyes:     Extraocular Movements: Extraocular movements intact.     Conjunctiva/sclera: Conjunctivae normal.  Cardiovascular:     Rate and Rhythm: Normal rate and regular rhythm.     Heart sounds: Normal heart sounds.  Pulmonary:     Effort: Pulmonary effort is normal.     Breath sounds: Normal breath sounds. No wheezing or rales.  Abdominal:     General: Bowel sounds are normal. There is no distension.     Palpations: Abdomen is soft.     Tenderness: There is no abdominal tenderness. There is no guarding.  Musculoskeletal:        General: Normal range of motion.     Cervical back: Normal range of motion and neck supple.  Skin:    General: Skin is warm and dry.  Neurological:     General: No focal deficit present.     Mental Status: He is oriented to person, place, and time.  Psychiatric:        Mood and Affect: Mood normal.        Thought Content: Thought content normal.         Judgment: Judgment normal.   UC Treatments / Results  Labs (all labs ordered are listed, but only abnormal results are displayed) Labs Reviewed - No data to display  EKG   Radiology No results found.  Procedures Procedures (including critical care time)  Medications Ordered in UC Medications - No data to display  Initial Impression / Assessment and Plan / UC Course  I have reviewed the triage vital signs and the nursing notes.  Pertinent labs & imaging results that were available during my care of the patient were reviewed by me and considered in my medical decision making (see chart for details).     Suspect eustachian tube dysfunction to be causing his symptoms.  We will treat with 3-day burst of prednisone, Flonase and recommended antihistamines as a trial to see if this improves his sinus headaches and eustachian tube issues.  Return for acutely worsening symptoms.  Final Clinical Impressions(s) / UC Diagnoses   Final diagnoses:  Dysfunction of both eustachian tubes   Discharge Instructions   None    ED Prescriptions     Medication Sig Dispense Auth. Provider   predniSONE (DELTASONE) 50 MG tablet Take 1 tab daily with breakfast for 3 days 3 tablet Volney American, PA-C   fluticasone [facility]) 50 MCG/ACT nasal spray Place 1 spray into both nostrils 2 (two) times daily as needed for allergies or rhinitis. 16 g Volney American, [place]      PDMP not reviewed this encounter.   Volney American, [place] 02/09/21 1731

## 2021-02-09 NOTE — ED Triage Notes (Signed)
Pt c/o headache and bilateral ear pain.   States water went into his ears.

## 2021-02-22 ENCOUNTER — Ambulatory Visit (INDEPENDENT_AMBULATORY_CARE_PROVIDER_SITE_OTHER): Payer: Medicaid Other | Admitting: Primary Care

## 2021-03-09 ENCOUNTER — Other Ambulatory Visit: Payer: Self-pay | Admitting: Family Medicine

## 2021-04-28 ENCOUNTER — Ambulatory Visit (INDEPENDENT_AMBULATORY_CARE_PROVIDER_SITE_OTHER): Payer: Medicaid Other | Admitting: Primary Care

## 2022-01-13 ENCOUNTER — Other Ambulatory Visit (HOSPITAL_COMMUNITY): Payer: Self-pay | Admitting: Neurosurgery

## 2022-01-13 ENCOUNTER — Other Ambulatory Visit: Payer: Self-pay | Admitting: Neurosurgery

## 2022-01-13 DIAGNOSIS — D496 Neoplasm of unspecified behavior of brain: Secondary | ICD-10-CM

## 2022-02-09 ENCOUNTER — Ambulatory Visit (HOSPITAL_COMMUNITY): Admission: RE | Admit: 2022-02-09 | Payer: Medicaid Other | Source: Ambulatory Visit

## 2022-02-09 ENCOUNTER — Encounter (HOSPITAL_COMMUNITY): Payer: Self-pay

## 2023-03-28 ENCOUNTER — Encounter (HOSPITAL_BASED_OUTPATIENT_CLINIC_OR_DEPARTMENT_OTHER): Payer: Self-pay | Admitting: Emergency Medicine

## 2023-03-28 ENCOUNTER — Emergency Department (HOSPITAL_BASED_OUTPATIENT_CLINIC_OR_DEPARTMENT_OTHER)
Admission: EM | Admit: 2023-03-28 | Discharge: 2023-03-28 | Disposition: A | Discharge status: Home / Self Care | Payer: Commercial Managed Care - HMO | Attending: Emergency Medicine | Admitting: Emergency Medicine

## 2023-03-28 ENCOUNTER — Emergency Department (HOSPITAL_BASED_OUTPATIENT_CLINIC_OR_DEPARTMENT_OTHER): Payer: Commercial Managed Care - HMO

## 2023-03-28 ENCOUNTER — Other Ambulatory Visit: Payer: Self-pay

## 2023-03-28 ENCOUNTER — Emergency Department (HOSPITAL_COMMUNITY): Payer: Commercial Managed Care - HMO

## 2023-03-28 DIAGNOSIS — G8929 Other chronic pain: Secondary | ICD-10-CM | POA: Insufficient documentation

## 2023-03-28 DIAGNOSIS — R519 Headache, unspecified: Secondary | ICD-10-CM | POA: Insufficient documentation

## 2023-03-28 LAB — CBC WITH DIFFERENTIAL/PLATELET
Abs Immature Granulocytes: 0.02 10*3/uL (ref 0.00–0.07)
Basophils Absolute: 0 10*3/uL (ref 0.0–0.1)
Basophils Relative: 1 %
Eosinophils Absolute: 0.3 10*3/uL (ref 0.0–0.5)
Eosinophils Relative: 4 %
HCT: 45.4 % (ref 39.0–52.0)
Hemoglobin: 16.1 g/dL (ref 13.0–17.0)
Immature Granulocytes: 0 %
Lymphocytes Relative: 25 %
Lymphs Abs: 1.7 10*3/uL (ref 0.7–4.0)
MCH: 29.7 pg (ref 26.0–34.0)
MCHC: 35.5 g/dL (ref 30.0–36.0)
MCV: 83.8 fL (ref 80.0–100.0)
Monocytes Absolute: 0.6 10*3/uL (ref 0.1–1.0)
Monocytes Relative: 9 %
Neutro Abs: 4 10*3/uL (ref 1.7–7.7)
Neutrophils Relative %: 61 %
Platelets: 183 10*3/uL (ref 150–400)
RBC: 5.42 MIL/uL (ref 4.22–5.81)
RDW: 13.5 % (ref 11.5–15.5)
WBC: 6.5 10*3/uL (ref 4.0–10.5)
nRBC: 0 % (ref 0.0–0.2)

## 2023-03-28 LAB — COMPREHENSIVE METABOLIC PANEL
ALT: 14 U/L (ref 0–44)
AST: 14 U/L — ABNORMAL LOW (ref 15–41)
Albumin: 4.3 g/dL (ref 3.5–5.0)
Alkaline Phosphatase: 42 U/L (ref 38–126)
Anion gap: 10 (ref 5–15)
BUN: 12 mg/dL (ref 6–20)
CO2: 25 mmol/L (ref 22–32)
Calcium: 9.1 mg/dL (ref 8.9–10.3)
Chloride: 99 mmol/L (ref 98–111)
Creatinine, Ser: 0.9 mg/dL (ref 0.61–1.24)
GFR, Estimated: >60 mL/min (ref >60)
Glucose, Bld: 345 mg/dL — ABNORMAL HIGH (ref 70–99)
Potassium: 4 mmol/L (ref 3.5–5.1)
Sodium: 134 mmol/L — ABNORMAL LOW (ref 135–145)
Total Bilirubin: 0.6 mg/dL (ref 0.3–1.2)
Total Protein: 7.5 g/dL (ref 6.5–8.1)

## 2023-03-28 MED ORDER — BUTALBITAL-APAP-CAFFEINE 50-325-40 MG PO TABS: 1.0000 | ORAL_TABLET | Freq: Four times a day (QID) | ORAL | 0 refills | Status: DC | PRN

## 2023-03-28 MED ORDER — SODIUM CHLORIDE 0.9 % IV BOLUS
1000.0000 mL | Freq: Once | INTRAVENOUS | Status: AC
  Administered 2023-03-28: 1000 mL via INTRAVENOUS

## 2023-03-28 MED ORDER — KETOROLAC TROMETHAMINE 15 MG/ML IJ SOLN
15.0000 mg | Freq: Once | INTRAMUSCULAR | Status: AC
  Administered 2023-03-28: 15 mg via INTRAVENOUS
  Filled 2023-03-28: qty 1

## 2023-03-28 MED ORDER — IOHEXOL 350 MG/ML SOLN
100.0000 mL | Freq: Once | INTRAVENOUS | Status: AC | PRN
  Administered 2023-03-28: 75 mL via INTRAVENOUS

## 2023-03-28 MED ORDER — MORPHINE SULFATE (PF) 4 MG/ML IV SOLN
4.0000 mg | Freq: Once | INTRAVENOUS | Status: AC
  Administered 2023-03-28: 4 mg via INTRAVENOUS
  Filled 2023-03-28: qty 1

## 2023-03-28 MED ORDER — OXYCODONE HCL 5 MG PO TABS: 5.0000 mg | ORAL_TABLET | ORAL | 0 refills | Status: DC | PRN

## 2023-03-28 MED ORDER — DIPHENHYDRAMINE HCL 50 MG/ML IJ SOLN
25.0000 mg | Freq: Once | INTRAMUSCULAR | Status: AC
  Administered 2023-03-28: 25 mg via INTRAVENOUS
  Filled 2023-03-28: qty 1

## 2023-03-28 MED ORDER — PROCHLORPERAZINE EDISYLATE 10 MG/2ML IJ SOLN
5.0000 mg | Freq: Once | INTRAMUSCULAR | Status: AC
  Administered 2023-03-28: 5 mg via INTRAVENOUS
  Filled 2023-03-28: qty 2

## 2023-03-28 NOTE — ED Notes (Signed)
Pt transported to MRI 

## 2023-03-28 NOTE — ED Notes (Signed)
Report given to the Charge RN at [facility]...  

## 2023-03-28 NOTE — ED Notes (Signed)
Pt informed to be driven over to [facility] ER... Do not stop off anywhere... Pt and family understood.Marland KitchenMarland KitchenMarland Kitchen

## 2023-03-28 NOTE — ED Provider Notes (Signed)
[facility] brain. Headache ongoing for months. 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  CT angio without acute finding at previous emergency department.  Physical Exam  BP 122/89 (BP Location: Left Arm)   Pulse 68   Temp 98.4 F (36.9 C) (Oral)   Resp 18   Wt 68.5 kg   SpO2 99%     Procedures  Procedures  ED Course / MDM   Clinical Course as of 03/28/23 2002  Tue Mar 28, 2023  1651 Will send to [facility] for MRI brain w/o, Dr Doran Durand accepting, will travel POV [SG]    Clinical Course User Index [SG] Sloan Leiter, DO   Medical Decision Making Amount and/or Complexity of Data Reviewed Labs: ordered. Radiology: ordered.  Risk Prescription drug management.   MRI obtained.  Shows stable left cerebellar tumor.  No acute findings.  Will discharge with neurology and PCP follow-up.  Patient and family are agreeable.  Discussed with attending.  She reports the pain medication given at family's request.  Patient discharged in stable condition.  Return precautions discussed.       Marita Kansas, PA-C 03/28/23 2043    Laurence Spates, MD 03/29/23 [phone]

## 2023-03-28 NOTE — Discharge Instructions (Addendum)
Please follow up with neurology  It was a pleasure caring for you today in the emergency department.  Please return to the emergency department for any worsening or worrisome symptoms.

## 2023-03-28 NOTE — ED Triage Notes (Signed)
Pt arrives to ED with c/o headache for several months. The headache is constant.  Pt notes headache worsens when he begins to eat. Hx brain tumor, last MRI June 2023. He does note left jaw/mouth pain. On exam in triage pt has swelling to left side of face.

## 2023-03-28 NOTE — ED Provider Notes (Signed)
[place] EMERGENCY DEPARTMENT AT [facility] Provider Note  CSN: [id number] Arrival date & time: 03/28/23 1009  Chief Complaint(s) Headache  HPI Larry Jenkins is a 52 y.o. male with past medical history as below, significant for recurrent headaches, cystic left cerebellar tumor follows with Dr. Conchita Paris who presents to the ED with complaint of left-sided neck pain, headache, facial swelling  Patient is companied by his spouse Larry Jenkins who serves as interpreter, prefers to have family translate for him rather than using language interpreter.  He has been having left-sided headaches, neck pain, facial swelling over the past 3 months, feels it is gradually worsened.  He has been having intermittent dizziness over the past 2 months.  He has used OTC analgesics without much relief of symptoms.  She was seen by neurosurgery and told that the tumor in his neck was a benign lesion but he and family member are unable to provide specific details in regards to the treatment plan for this.  He has no fevers or chills, no recent falls or head injuries, no dysphagia or dysphonia, no difficulty breathing, does report some generalized bodyaches.  No change in bowel or bladder function.  Past Medical History Past Medical History:  Diagnosis Date   Gout    There are no problems to display for this patient.  Home Medication(s) Prior to Admission medications   Medication Sig Start Date End Date Taking? Authorizing Provider  butalbital-acetaminophen-caffeine (FIORICET) 50-325-40 MG tablet Take 1-2 tablets by mouth every 6 (six) hours as needed for headache. 03/28/23 03/27/24 Yes Tanda Rockers A, DO  oxyCODONE (ROXICODONE) 5 MG immediate release tablet Take 1 tablet (5 mg total) by mouth every 4 (four) hours as needed for severe pain or breakthrough pain. 03/28/23  Yes Ali, Amjad, PA-C  fluticasone (FLONASE) 50 MCG/ACT nasal spray Place 1 spray into both nostrils 2 (two) times daily as needed for allergies or  rhinitis. 02/09/21   Particia Nearing, PA-C  predniSONE (DELTASONE) 50 MG tablet Take 1 tab daily with breakfast for 3 days 02/09/21   Particia Nearing, PA-C                                                                                                                                    Past Surgical History History reviewed. No pertinent surgical history. Family History Family History  Problem Relation Age of Onset   Diabetes Neg Hx    Heart failure Neg Hx    Cancer Neg Hx    Hyperlipidemia Neg Hx    Hypertension Neg Hx     Social History Social History   Tobacco Use   Smoking status: Never   Smokeless tobacco: Never  Substance Use Topics   Alcohol use: Yes    Comment: occasional   Drug use: No   Allergies Patient has no known allergies.  Review of Systems Review of Systems  Constitutional:  Negative for chills  and fever.  HENT:  Positive for facial swelling. Negative for trouble swallowing.   Eyes:  Negative for photophobia and visual disturbance.  Respiratory:  Negative for cough and shortness of breath.   Cardiovascular:  Negative for chest pain and palpitations.  Gastrointestinal:  Negative for abdominal pain, nausea and vomiting.  Endocrine: Negative for polydipsia and polyuria.  Genitourinary:  Negative for difficulty urinating and hematuria.  Musculoskeletal:  Positive for arthralgias and neck pain. Negative for gait problem and joint swelling.  Skin:  Negative for pallor and rash.  Neurological:  Positive for dizziness and headaches. Negative for syncope.  Psychiatric/Behavioral:  Negative for agitation and confusion.     Physical Exam Vital Signs  I have reviewed the triage vital signs BP 134/67 (BP Location: Left Arm)   Pulse 75   Temp 98 F (36.7 C) (Oral)   Resp 15   Wt 68.5 kg   SpO2 99%  Physical Exam Vitals and nursing note reviewed.  Constitutional:      General: He is not in acute distress.    Appearance: Normal appearance. He is  well-developed.  HENT:     Head: Normocephalic and atraumatic. No raccoon eyes, Battle's sign, right periorbital erythema or left periorbital erythema.     Jaw: There is normal jaw occlusion. No trismus.      Right Ear: External ear normal.     Left Ear: External ear normal.     Mouth/Throat:     Mouth: Mucous membranes are moist.  Eyes:     General: No scleral icterus.    Extraocular Movements: Extraocular movements intact.     Pupils: Pupils are equal, round, and reactive to light.  Neck:   Cardiovascular:     Rate and Rhythm: Normal rate and regular rhythm.     Pulses: Normal pulses.     Heart sounds: Normal heart sounds.  Pulmonary:     Effort: Pulmonary effort is normal. No respiratory distress.     Breath sounds: Normal breath sounds.  Abdominal:     General: Abdomen is flat.     Palpations: Abdomen is soft.     Tenderness: There is no abdominal tenderness.  Musculoskeletal:     Cervical back: No rigidity.     Right lower leg: No edema.     Left lower leg: No edema.  Skin:    General: Skin is warm and dry.     Capillary Refill: Capillary refill takes less than 2 seconds.  Neurological:     Mental Status: He is alert and oriented to person, place, and time.     GCS: GCS eye subscore is 4. GCS verbal subscore is 5. GCS motor subscore is 6.     Cranial Nerves: Cranial nerves 2-12 are intact. No facial asymmetry.     Sensory: Sensation is intact.     Motor: Motor function is intact.     Coordination: Coordination is intact.     Gait: Gait is intact.  Psychiatric:        Mood and Affect: Mood normal.        Behavior: Behavior normal.     ED Results and Treatments Labs (all labs ordered are listed, but only abnormal results are displayed) Labs Reviewed  COMPREHENSIVE METABOLIC PANEL - Abnormal; Notable for the following components:      Result Value   Sodium 134 (*)    Glucose, Bld 345 (*)    AST 14 (*)    All other components within normal  limits  CBC WITH  DIFFERENTIAL/PLATELET                                                                                                                          Radiology No results found.  Pertinent labs & imaging results that were available during my care of the patient were reviewed by me and considered in my medical decision making (see MDM for details).  Medications Ordered in ED Medications  prochlorperazine (COMPAZINE) injection 5 mg (5 mg Intravenous Given 03/28/23 1316)  sodium chloride 0.9 % bolus 1,000 mL (0 mLs Intravenous Stopped 03/28/23 1500)  diphenhydrAMINE (BENADRYL) injection 25 mg (25 mg Intravenous Given 03/28/23 1315)  iohexol (OMNIPAQUE) 350 MG/ML injection 100 mL (75 mLs Intravenous Contrast Given 03/28/23 1301)  ketorolac (TORADOL) 15 MG/ML injection 15 mg (15 mg Intravenous Given 03/28/23 1557)  morphine (PF) 4 MG/ML injection 4 mg (4 mg Intravenous Given 03/28/23 1559)                                                                                                                                     Procedures Procedures  (including critical care time)  Medical Decision Making / ED Course    Medical Decision Making:    Jermelle Maycock is a 52 y.o. male with past medical history as below, significant for recurrent headaches, cystic left cerebellar tumor follows with Dr. Conchita Paris who presents to the ED with complaint of left-sided neck pain, headache, facial swelling. The complaint involves an extensive differential diagnosis and also carries with it a high risk of complications and morbidity.  Serious etiology was considered. Ddx includes but is not limited to: Differential diagnosis includes but is not exclusive to subarachnoid hemorrhage, meningitis, encephalitis, previous head trauma, cavernous venous thrombosis, muscle tension headache, glaucoma, temporal arteritis, migraine or migraine equivalent, etc.   Complete initial physical exam performed, notably the patient  was neuroexam is  nonfocal, no acute distress.    Reviewed and confirmed nursing documentation for past medical history, family history, social history.  Vital signs reviewed.    Narrative: 52 year old male here with recurrent headache over the past 3 months Neuroexam is nonfocal, he does have a palpable nodule adjacent to midline cervical spine on the left, has some pain with neck movement on exam, no meningismus Will collect screening labs, CT imaging, give analgesics, reassess  Clinical Course as of 03/31/23 1608  Tue Mar 28, 2023  1651 Will send to [facility] for MRI brain w/o, Dr Doran Durand accepting, will travel POV [SG]    Clinical Course User Index [SG] Sloan Leiter, DO    Feeling better on recheck  Labs/imaging stable  Pt with ongoing headache, neuro exam non-focal  Will send to [facility] for MRI w/o   Can f/u with neuro in o/p if neg                   Additional history obtained: -Additional history obtained from spouse -External records from outside source obtained and reviewed including: Chart review including previous notes, labs, imaging, consultation notes including  Prior nsgy documentation    Lab Tests: -I ordered, reviewed, and interpreted labs.   The pertinent results include:   Labs Reviewed  COMPREHENSIVE METABOLIC PANEL - Abnormal; Notable for the following components:      Result Value   Sodium 134 (*)    Glucose, Bld 345 (*)    AST 14 (*)    All other components within normal limits  CBC WITH DIFFERENTIAL/PLATELET    Notable for stable labs  EKG   EKG Interpretation Date/Time:    Ventricular Rate:    PR Interval:    QRS Duration:    QT Interval:    QTC Calculation:   R Axis:      Text Interpretation:           Imaging Studies ordered: I ordered imaging studies including CTA head/neck, cxr I independently visualized the following imaging with scope of interpretation limited to determining acute life threatening conditions related to  emergency care; findings noted above, significant for stable imaging  I independently visualized and interpreted imaging. I agree with the radiologist interpretation   Medicines ordered and prescription drug management: Meds ordered this encounter  Medications   prochlorperazine (COMPAZINE) injection 5 mg   sodium chloride 0.9 % bolus 1,000 mL   diphenhydrAMINE (BENADRYL) injection 25 mg   iohexol (OMNIPAQUE) 350 MG/ML injection 100 mL   ketorolac (TORADOL) 15 MG/ML injection 15 mg   morphine (PF) 4 MG/ML injection 4 mg   butalbital-acetaminophen-caffeine (FIORICET) 50-325-40 MG tablet    Sig: Take 1-2 tablets by mouth every 6 (six) hours as needed for headache.    Dispense:  20 tablet    Refill:  0   oxyCODONE (ROXICODONE) 5 MG immediate release tablet    Sig: Take 1 tablet (5 mg total) by mouth every 4 (four) hours as needed for severe pain or breakthrough pain.    Dispense:  10 tablet    Refill:  0    Order Specific Question:   Supervising Provider    Answer:   MILLER, BRIAN [[id number]]    -I have reviewed the patients home medicines and have made adjustments as needed   Consultations Obtained: na   Cardiac Monitoring: Continuous pulse oximetry interpreted by myself, 99% on RA.    Social Determinants of Health:  Diagnosis or treatment significantly limited by social determinants of health: non english speaking    Reevaluation: After the interventions noted above, I reevaluated the patient and found that they have improved  Co morbidities that complicate the patient evaluation  Past Medical History:  Diagnosis Date   Gout       Dispostion: Disposition decision including need for hospitalization was considered, and patient transferred.    Final Clinical Impression(s) / ED Diagnoses Final diagnoses:  Chronic nonintractable headache, unspecified headache type  Tanda Rockers A, DO 03/31/23 [phone]

## 2023-03-31 ENCOUNTER — Encounter (HOSPITAL_COMMUNITY): Payer: Self-pay | Admitting: Emergency Medicine

## 2023-05-18 ENCOUNTER — Telehealth (INDEPENDENT_AMBULATORY_CARE_PROVIDER_SITE_OTHER): Payer: Self-pay | Admitting: Otolaryngology

## 2023-05-18 ENCOUNTER — Encounter (INDEPENDENT_AMBULATORY_CARE_PROVIDER_SITE_OTHER): Payer: Self-pay

## 2023-05-18 ENCOUNTER — Ambulatory Visit (INDEPENDENT_AMBULATORY_CARE_PROVIDER_SITE_OTHER): Payer: Managed Care, Other (non HMO) | Admitting: Otolaryngology

## 2023-05-18 VITALS — Ht 65.0 in | Wt 151.0 lb

## 2023-05-18 DIAGNOSIS — K1123 Chronic sialoadenitis: Secondary | ICD-10-CM

## 2023-05-18 DIAGNOSIS — R519 Headache, unspecified: Secondary | ICD-10-CM | POA: Diagnosis not present

## 2023-05-18 MED ORDER — METHOCARBAMOL 750 MG PO TABS: 750.0000 mg | ORAL_TABLET | Freq: Two times a day (BID) | ORAL | 0 refills | Status: DC

## 2023-05-18 MED ORDER — AMOXICILLIN-POT CLAVULANATE 875-125 MG PO TABS: 1.0000 | ORAL_TABLET | Freq: Two times a day (BID) | ORAL | 0 refills | Status: DC

## 2023-05-18 MED ORDER — AMOXICILLIN-POT CLAVULANATE 875-125 MG PO TABS: 1.0000 | ORAL_TABLET | Freq: Two times a day (BID) | ORAL | 0 refills | Status: AC

## 2023-05-18 NOTE — Progress Notes (Signed)
Dear Larry Jenkins, Here is my assessment for our mutual patient, Larry Jenkins. Thank you for allowing me the opportunity to care for your patient. Please do not hesitate to contact me should you have any other questions. Sincerely, Larry Jenkins  Otolaryngology Clinic Note Referring provider: Dr. Jeanie Jenkins HPI:  Larry Jenkins is a 52 y.o. male kindly referred by Larry Jenkins for evaluation of parotitis.  Per Larry Jenkins note and patient: Left sided facial pain and swelling intermittently since July 2024 in [place]. Especially with eating, and worst with first bite causing pain. Headaches on left side persistent and all the time. Worse with chewing, no fevers. Prescribed augmentin and oxycodone, did not help. Has not done anything otherwise. Does not smoke. Pain never compltely goes away but first bite is the worst. Denies teeth grinding but does report some trismus  No dry mouth/dry eyes, no autoimmune history  No change in hearing or pain in the ear, on right. No trouble swallowing, no lumps or bumps on the neck otherwise. No skin lesions. No weight loss. No other medications.  PMHx: Left cerebellar tumor  Previously seen by ENT - Larry Jenkins - asymmetrical SNHL; prednisone given. Not complaining of any otologic symptoms currently.  Interpreter provided but not used since patient wished for significant other to interpret.    PMH/Meds/All/SocHx/FamHx/ROS:   Past Medical History:  Diagnosis Date   Gout    Denies heart/lung/kidney issues, diabetes, previous diagnosis of cancer  History reviewed. No pertinent surgical history. Denies history of head or neck surgery  Family History  Problem Relation Age of Onset   Diabetes Neg Hx    Heart failure Neg Hx    Cancer Neg Hx    Hyperlipidemia Neg Hx    Hypertension Neg Hx    No family history of bleeding disorders or difficulty with anesthesia  Social Connections: Not on file    Tobacco: denies; alcohol: 1 drink/week   Current  Outpatient Medications:    amoxicillin-clavulanate (AUGMENTIN) 875-125 MG tablet, Take 1 tablet by mouth 2 (two) times daily for 14 days., Disp: 20 tablet, Rfl: 0   methocarbamol (ROBAXIN-750) 750 MG tablet, Take 1 tablet (750 mg total) by mouth in the morning and at bedtime., Disp: 28 tablet, Rfl: 0   butalbital-acetaminophen-caffeine (FIORICET) 50-325-40 MG tablet, Take 1-2 tablets by mouth every 6 (six) hours as needed for headache. (Patient not taking: Reported on 05/18/2023), Disp: 20 tablet, Rfl: 0   fluticasone (FLONASE) 50 MCG/ACT nasal spray, Place 1 spray into both nostrils 2 (two) times daily as needed for allergies or rhinitis. (Patient not taking: Reported on 05/18/2023), Disp: 16 g, Rfl: 0   oxyCODONE (ROXICODONE) 5 MG immediate release tablet, Take 1 tablet (5 mg total) by mouth every 4 (four) hours as needed for severe pain or breakthrough pain. (Patient not taking: Reported on 05/18/2023), Disp: 10 tablet, Rfl: 0   predniSONE (DELTASONE) 50 MG tablet, Take 1 tab daily with breakfast for 3 days (Patient not taking: Reported on 05/18/2023), Disp: 3 tablet, Rfl: 0    Physical Exam:   Ht 5\' 5"  (1.651 m)   Wt 151 lb (68.5 kg)   BMI 25.13 kg/m    Salient findings:  CN II-XII intact AS: EAC clear and TM intact with well pneumatized middle ear spaces AD: EAC clear; small central perforation (~10%) inferior to malleus but otherwise well aerated ME space Anterior rhino: septum midline; bilateral inferior turbinates without significant hypertrophy No lesions of oral cavity/oropharynx; dentition fair, but some  cavities left mandibular and maxillary molar; mild trismus (2.5 cm) but able to express saliva easily from both parotid ducts. 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 The left ear shows normal sloping to mild high-frequency sensorineural hearing loss. Patient had to be reinstructed. Stenger was positive from 500 Hz through 4000 Hz SRT's: Right ear shows 65 dB HL in the left ear shows 20 dB HL Word recognition scores: Not done due to language barrier Distortion-product otoacoustic emissions present in the right ear from 1000 through 4000 Hz. Present in the left ear from 1000 through 5000 Hz  Stenger was positive from 500 to 4000 Hz.  CT Angio (03/28/2023): no stones, cannot see any parotid nodules. No other masses noted. Left parotid slightly larger than right MRI Head w/o (03/28/2023): negative; no stones; no parotid lesions noted   Procedures:  None  Impression & Plans:  Larry Jenkins is a 52 y.o. male with no contributory PMHx with:  Left facial pain - Ddx including chronic parotitis v/s musculoskeletal or dental etiology (grinding chewing) - Does not fit neatly into any category - worse with first bite and chewing could be parotitis but temporalis tenderness and point tenderness as well as mild trismus can be TMJ/Muscloskeletal (masseter or temporalis spasm?); unclear. Many etiologies for chronic parotitis - no stones seen on CTA or MRI recently when he was having symptoms but studies not optimal  - We'll start with conservative management:  - augmentin 875 mg BID x 14d - Trial Robaxin 750 mg BID x14d - Warm compresses - Gentle jaw stretching - Dentistry eval for any kind of dental contributory issue - help r/o Sjogren's/autoimmune though very unlikely with SSA, and ANA - Want to rule out any parotid nodules causing this and CT is only an angio so will get formal CT Neck with  - f/u 1 month  I have personally spent 62 minutes involved  in face-to-face and non-face-to-face activities for this patient on the day of the visit.  Professional time spent includes the following activities, in addition to those noted in the documentation: preparing to see the patient (review of outside documentation and results - MRI/CT), performing a medically appropriate examination and/or evaluation, counseling and educating the patient/family/caregiver, ordering medications, referring and communicating with other healthcare professionals, documenting clinical information in the electronic or other health record, independently interpreting results and communicating results with the patient/family/caregiver   Thank you for allowing me the opportunity to care for your patient. Please do not hesitate to contact me should you have any other questions.  Sincerely, Jovita Kussmaul, MD Otolarynoglogist (ENT), [facility] Phone: [phone] Fax: [phone]  05/18/2023, 8:57 AM

## 2023-05-18 NOTE — Telephone Encounter (Signed)
Patient stated that they do not need an interpreter.  I made the change on the patient record per his request.  Interpreter was told they were not needed.

## 2023-05-18 NOTE — Patient Instructions (Signed)
I have ordered an imaging study for you to complete prior to your next visit. Please call [facility] Scheduling at [phone] to schedule your imaging if you have not received a call within 24 hours. If you are unable to complete your imaging study prior to your next scheduled visit please call our office to let us know.

## 2023-05-18 NOTE — Addendum Note (Signed)
Addended by: Jovita Kussmaul on: 05/18/2023 12:29 PM   Modules accepted: Orders

## 2023-06-01 LAB — ANA: Anti Nuclear Antibody (ANA): NEGATIVE

## 2023-06-01 LAB — SJOGRENS SYNDROME-A EXTRACTABLE NUCLEAR ANTIBODY: SSA (Ro) (ENA) Antibody, IgG: <1 AI

## 2023-06-01 LAB — SJOGRENS SYNDROME-B EXTRACTABLE NUCLEAR ANTIBODY: SSB (La) (ENA) Antibody, IgG: <1 AI

## 2023-06-12 ENCOUNTER — Institutional Professional Consult (permissible substitution) (INDEPENDENT_AMBULATORY_CARE_PROVIDER_SITE_OTHER): Payer: Commercial Managed Care - HMO

## 2023-06-19 ENCOUNTER — Ambulatory Visit (INDEPENDENT_AMBULATORY_CARE_PROVIDER_SITE_OTHER): Payer: Managed Care, Other (non HMO)

## 2023-06-26 ENCOUNTER — Ambulatory Visit (HOSPITAL_COMMUNITY)
Admission: RE | Admit: 2023-06-26 | Discharge: 2023-06-26 | Disposition: A | Discharge status: Home / Self Care | Payer: Commercial Managed Care - HMO | Source: Ambulatory Visit | Attending: Otolaryngology | Admitting: Otolaryngology

## 2023-06-26 DIAGNOSIS — R519 Headache, unspecified: Secondary | ICD-10-CM | POA: Diagnosis present

## 2023-06-26 DIAGNOSIS — K1123 Chronic sialoadenitis: Secondary | ICD-10-CM | POA: Diagnosis present

## 2023-06-26 MED ORDER — IOHEXOL 350 MG/ML SOLN
75.0000 mL | Freq: Once | INTRAVENOUS | Status: AC | PRN
  Administered 2023-06-26: 75 mL via INTRAVENOUS

## 2023-07-03 ENCOUNTER — Ambulatory Visit (INDEPENDENT_AMBULATORY_CARE_PROVIDER_SITE_OTHER): Payer: Commercial Managed Care - HMO | Admitting: Diagnostic Neuroimaging

## 2023-07-03 ENCOUNTER — Encounter: Payer: Self-pay | Admitting: Diagnostic Neuroimaging

## 2023-07-03 VITALS — BP 126/83 | HR 77 | Ht 65.0 in | Wt 155.0 lb

## 2023-07-03 DIAGNOSIS — K1120 Sialoadenitis, unspecified: Secondary | ICD-10-CM

## 2023-07-03 DIAGNOSIS — R739 Hyperglycemia, unspecified: Secondary | ICD-10-CM

## 2023-07-03 DIAGNOSIS — M542 Cervicalgia: Secondary | ICD-10-CM

## 2023-07-03 DIAGNOSIS — R519 Headache, unspecified: Secondary | ICD-10-CM | POA: Diagnosis not present

## 2023-07-03 NOTE — Progress Notes (Signed)
[facility]  PATIENT: Larry Jenkins DOB: [birth year]  REFERRING CLINICIAN: Sloan Leiter, DO HISTORY FROM: patient  REASON FOR VISIT: new consult   HISTORICAL  CHIEF COMPLAINT:  Chief Complaint  Patient presents with   New Patient (Initial Visit)    Patient in room #7 with his wife. Patient states he been having bad headaches for the past year. Patient states when he take a bit of food it cause his headaches. Patient has an recent MRI and labs that hasn't been viewed by Dr Allena Katz.    HISTORY OF PRESENT ILLNESS:   52 year old male here for evaluation of headaches and left jaw/neck pain.  Symptoms started around June 2024 when he was on vacation in [place] with his significant other.  He was having onset of daily global headaches, worse in the morning, associated with dizziness.  No sensitive light or sound.  Also noted some pain in the left posterior auricular, mastoid and neck region associated with left jaw and facial swelling.  Jaw and neck pain symptoms would be triggered when he was about to take a bite of food.  He went to the emergency room in August 2024 for evaluation.  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  CT of the neck has been ordered, which has been done about a week ago but report is pending.  He has not been to the dentist in a while.  Since that time left facial swelling has reduced but headaches and the jaw/neck pain can issues continue.   REVIEW OF SYSTEMS: Full 14 system review of systems performed and negative with exception of: as per HPI.  ALLERGIES: No Known Allergies  HOME MEDICATIONS: Outpatient Medications Prior to Visit  Medication Sig Dispense Refill   acetaminophen (TYLENOL) 500 MG tablet Take 500 mg by mouth every 4 (four) hours as needed.     butalbital-acetaminophen-caffeine (FIORICET) 50-325-40 MG tablet Take 1-2 tablets by mouth every 6 (six) hours as  needed for headache. (Patient not taking: Reported on 05/18/2023) 20 tablet 0   fluticasone (FLONASE) 50 MCG/ACT nasal spray Place 1 spray into both nostrils 2 (two) times daily as needed for allergies or rhinitis. (Patient not taking: Reported on 05/18/2023) 16 g 0   methocarbamol (ROBAXIN-750) 750 MG tablet Take 1 tablet (750 mg total) by mouth in the morning and at bedtime. (Patient not taking: Reported on 07/03/2023) 28 tablet 0   oxyCODONE (ROXICODONE) 5 MG immediate release tablet Take 1 tablet (5 mg total) by mouth every 4 (four) hours as needed for severe pain or breakthrough pain. (Patient not taking: Reported on 05/18/2023) 10 tablet 0   predniSONE (DELTASONE) 50 MG tablet Take 1 tab daily with breakfast for 3 days (Patient not taking: Reported on 05/18/2023) 3 tablet 0   No facility-administered medications prior to visit.    PAST MEDICAL HISTORY: Past Medical History:  Diagnosis Date   Gout     PAST SURGICAL HISTORY: History reviewed. No pertinent surgical history.  FAMILY HISTORY: Family History  Problem Relation Age of Onset   Diabetes Neg Hx    Heart failure Neg Hx    Cancer Neg Hx    Hyperlipidemia Neg Hx    Hypertension Neg Hx     SOCIAL HISTORY: Social History   Socioeconomic History   Marital status: Married    Spouse name: Not on file   Number of children: Not on file  Years of education: Not on file   Highest education level: Not on file  Occupational History   Not on file  Tobacco Use   Smoking status: Never   Smokeless tobacco: Never  Substance and Sexual Activity   Alcohol use: Yes    Comment: occasional   Drug use: No   Sexual activity: Not on file  Other Topics Concern   Not on file  Social History Narrative   ** Merged History Encounter **       Social Determinants of Health   Financial Resource Strain: Not on file  Food Insecurity: Not on file  Transportation Needs: Not on file  Physical Activity: Not on file  Stress: Not on file   Social Connections: Not on file  Intimate Partner Violence: Not on file     PHYSICAL EXAM  GENERAL EXAM/CONSTITUTIONAL: Vitals:  Vitals:   07/03/23 0853  BP: 126/83  Pulse: 77  Weight: 155 lb (70.3 kg)  Height: 5\' 5"  (1.651 m)   Body mass index is 25.79 kg/m. Wt Readings from Last 3 Encounters:  07/03/23 155 lb (70.3 kg)  05/18/23 151 lb (68.5 kg)  03/28/23 151 lb (68.5 kg)   Patient is in no distress; well developed, nourished and groomed; neck is supple  CARDIOVASCULAR: Examination of carotid arteries is normal; no carotid bruits Regular rate and rhythm, no murmurs Examination of peripheral vascular system by observation and palpation is normal  EYES: Ophthalmoscopic exam of optic discs and posterior segments is normal; no papilledema or hemorrhages No results found.  MUSCULOSKELETAL: Gait, strength, tone, movements noted in Neurologic exam below  NEUROLOGIC: MENTAL STATUS:      No data to display         awake, alert, oriented to person, place and time recent and remote memory intact normal attention and concentration language fluent, comprehension intact, naming intact fund of knowledge appropriate  CRANIAL NERVE:  2nd - no papilledema on fundoscopic exam 2nd, 3rd, 4th, 6th - pupils equal and reactive to light, visual fields full to confrontation, extraocular muscles intact, no nystagmus 5th - facial sensation symmetric 7th - facial strength symmetric 8th - hearing intact 9th - palate elevates symmetrically, uvula midline 11th - shoulder shrug symmetric 12th - tongue protrusion midline  MOTOR:  normal bulk and tone, full strength in the BUE, BLE  SENSORY:  normal and symmetric to light touch, pinprick, temperature, vibration  COORDINATION:  finger-nose-finger, fine finger movements normal  REFLEXES:  deep tendon reflexes present and symmetric  GAIT/STATION:  narrow based gait; able to walk on toes, heels and tandem; romberg is  negative     DIAGNOSTIC DATA (LABS, IMAGING, TESTING) - I reviewed patient records, labs, notes, testing and imaging myself where available.  Lab Results  Component Value Date   WBC 6.5 03/28/2023   HGB 16.1 03/28/2023   HCT 45.4 03/28/2023   MCV 83.8 03/28/2023   PLT 183 03/28/2023      Component Value Date/Time   NA 134 (L) 03/28/2023 1032   K 4.0 03/28/2023 1032   CL 99 03/28/2023 1032   CO2 25 03/28/2023 1032   GLUCOSE 345 (H) 03/28/2023 1032   BUN 12 03/28/2023 1032   CREATININE 0.90 03/28/2023 1032   CALCIUM 9.1 03/28/2023 1032   PROT 7.5 03/28/2023 1032   ALBUMIN 4.3 03/28/2023 1032   AST 14 (L) 03/28/2023 1032   ALT 14 03/28/2023 1032   ALKPHOS 42 03/28/2023 1032   BILITOT 0.6 03/28/2023 1032   GFRNONAA >60  03/28/2023 1032   No results found for: "CHOL", "HDL", "LDLCALC", "LDLDIRECT", "TRIG", "CHOLHDL" No results found for: "HGBA1C" No results found for: "VITAMINB12" No results found for: "TSH"   03/28/23 CTA head / neck 1. No acute head CT finding. 2. Low-density lesion of the left cerebellum may show some slight contraction compared to the prior MRI study. Rounded calcification within the substance is measured at 10 mm in diameter, similar to the prior MRI study. No vascular component of the lesion is identified. This lesion is consistent with a benign or indolent lesion. 3. Normal CT angiography of the head and neck.  03/28/23 MRI brain 1. No acute finding. 2. 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   ASSESSMENT AND PLAN  52 y.o. year old male here with:  Dx:  1. Chronic daily headache   2. Hyperglycemia   3. Neck pain on left side   4. Parotiditis     PLAN:  LEFT JAW PAIN / LEFT PAROTID SWELLING / POST-AURICULAR TENDERNESS / MASTOID TENDERNESS - could be parotiditis; follow up CT neck and ENT eval - agree with dental evaluation  CHRONIC DAILY HEADACHES - check labs - continue ibuprofen  / tylenol as needed for headaches  HYPERGLYCEMIA - noted to have glucose 345 in Aug 2024  Orders Placed This Encounter  Procedures   Hemoglobin A1c   Vitamin B12   Sedimentation Rate   C-reactive Protein   Return in about 3 months (around 10/01/2023) for pending if symptoms worsen or fail to improve, pending test results.    Suanne Marker, MD 07/03/2023, 10:05 AM Certified in Neurology, Neurophysiology and Neuroimaging  [facility] [address], [address] [place], [place] [address] [phone]

## 2023-07-03 NOTE — Patient Instructions (Signed)
  LEFT JAW PAIN / LEFT PAROTID SWELLING / POST-AURICULAR TENDERNESS / MASTOID TENDERNESS - could be parotiditis; follow up CT neck and ENT eval - agree with dental evaluation  CHRONIC DAILY HEADACHES - check labs (noted to have glucose 345 in Aug 2024) - continue ibuprofen / tylenol as needed for headaches

## 2023-07-04 LAB — HEMOGLOBIN A1C
Est. average glucose Bld gHb Est-mCnc: 223 mg/dL
Hgb A1c MFr Bld: 9.4 % — ABNORMAL HIGH (ref 4.8–5.6)

## 2023-07-04 LAB — C-REACTIVE PROTEIN: CRP: 2 mg/L (ref 0–10)

## 2023-07-04 LAB — SEDIMENTATION RATE: Sed Rate: 2 mm/h (ref 0–30)

## 2023-07-04 LAB — VITAMIN B12: Vitamin B-12: 607 pg/mL (ref 232–1245)

## 2023-07-06 ENCOUNTER — Telehealth: Payer: Self-pay

## 2023-07-06 NOTE — Telephone Encounter (Signed)
I called patient. He and his significant other, Selena Batten, were on the line. I discussed his lab results with him. He reports that he is seeing a MD at [facility] and will follow up with them ASAP to discuss elevated HgA1C. Pt verbalized understanding of results. Pt had no questions at this time but was encouraged to call back if questions arise.

## 2023-07-06 NOTE — Telephone Encounter (Signed)
-----   Message from [facility] sent at 07/05/2023  4:56 PM EST ----- A1c significant elevated 9.4 consistent with new diagnosis of diabetes.  Recommend follow-up with PCP ASAP.  Other labs are okay.

## 2023-07-18 ENCOUNTER — Ambulatory Visit (INDEPENDENT_AMBULATORY_CARE_PROVIDER_SITE_OTHER): Payer: Commercial Managed Care - HMO | Admitting: Otolaryngology

## 2023-07-18 ENCOUNTER — Encounter (INDEPENDENT_AMBULATORY_CARE_PROVIDER_SITE_OTHER): Payer: Self-pay

## 2023-07-18 ENCOUNTER — Telehealth (INDEPENDENT_AMBULATORY_CARE_PROVIDER_SITE_OTHER): Payer: Self-pay | Admitting: Otolaryngology

## 2023-07-18 VITALS — Ht 66.0 in | Wt 155.0 lb

## 2023-07-18 DIAGNOSIS — M2559 Pain in other specified joint: Secondary | ICD-10-CM | POA: Diagnosis not present

## 2023-07-18 DIAGNOSIS — G8929 Other chronic pain: Secondary | ICD-10-CM

## 2023-07-18 DIAGNOSIS — R5383 Other fatigue: Secondary | ICD-10-CM

## 2023-07-18 DIAGNOSIS — R519 Headache, unspecified: Secondary | ICD-10-CM | POA: Diagnosis not present

## 2023-07-18 NOTE — Telephone Encounter (Signed)
[employer] insurance is inactive as of 05/02/2023 per Latricia Heft. @ [employer]

## 2023-07-18 NOTE — Progress Notes (Signed)
Otolaryngology Clinic Note Referring provider: Dr. Jeanie Sewer HPI:  Larry Jenkins is a 52 y.o. male kindly referred for evaluation of parotitis.   Initial visit (05/18/2023): Per Dr. Marnee Guarneri note and patient: Left sided facial pain and swelling intermittently since July 2024 in [place]. Especially with eating, and worst with first bite causing pain. Headaches on left side persistent and all the time. Worse with chewing, no fevers. Prescribed augmentin and oxycodone, did not help. Has not done anything otherwise. Does not smoke. Pain never completely goes away but first bite is the worst. Denies teeth grinding but does report some trismus  No dry mouth/dry eyes, no autoimmune history  No change in hearing or pain in the ear, on right. No trouble swallowing, no lumps or bumps on the neck otherwise. No skin lesions. No weight loss. No other medications.  -------------------------------------------------------------------- His prior CT was unremarkable, and ddx included chronic parotitis v/s musculoskeletal or dental etiology (grinding chewing) given point tenderness over masseter/temporalis. We decided on conservative management, dental referral and dedicated CT Neck imaging. He presents with it today.  In the interim, he has seen neuro - who thinks maybe parotitis is causing his symptoms. Also having daily headaches - checking labs, and doing ibuprofen/tylenol as needed. He has also had labs  07/18/2023: He reports that he is doing about the same - he reports that his facial swelling has resolved and left TMJ pain that was constant has resolved. He now has bilateral posterior neck pain/headache that goes to his vertex. It is constant. No photo sensitivity or light sensitivity or migraine type of symptoms. His neck does feel stiff. CT on review was unremarkable. He does report that he is having joint pains and feels tired all the time. Oxycodone did  help.  ----------------------------------------------------------------------- PMHx: Left cerebellar tumor  Interpreter provided but not used since patient wished for significant other to interpret.    PMH/Meds/All/SocHx/FamHx/ROS:   Past Medical History:  Diagnosis Date   Gout    Denies heart/lung/kidney issues, diabetes, previous diagnosis of cancer  No past surgical history on file. Denies history of head or neck surgery  Family History  Problem Relation Age of Onset   Diabetes Neg Hx    Heart failure Neg Hx    Cancer Neg Hx    Hyperlipidemia Neg Hx    Hypertension Neg Hx    No family history of bleeding disorders or difficulty with anesthesia  Social Connections: Not on file    Tobacco: denies; alcohol: 1 drink/week   Current Outpatient Medications:    acetaminophen (TYLENOL) 500 MG tablet, Take 500 mg by mouth every 4 (four) hours as needed., Disp: , Rfl:     Physical Exam:   There were no vitals taken for this visit.   Salient findings:  CN II-XII intact AS: EAC clear and TM intact with well pneumatized middle ear spaces AD: EAC clear; small central perforation (~10%) inferior to malleus but otherwise well aerated ME space Anterior rhino: septum midline; bilateral inferior turbinates without significant hypertrophy No lesions of oral cavity/oropharynx; dentition fair, but some cavities left mandibular and maxillary molar; no trismus today but able to express saliva easily from both parotid ducts. 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 The left ear shows normal sloping to mild high-frequency sensorineural hearing loss. Patient had to be reinstructed. Stenger was positive from 500 Hz through 4000 Hz SRT's: Right ear shows 65 dB HL in the left ear shows 20 dB HL Word recognition scores: Not done due to language barrier Distortion-product otoacoustic emissions present in the right ear from 1000 through 4000 Hz. Present in the left ear from 1000 through 5000 Hz Stenger was positive from 500 to 4000 Hz.  CT Angio (03/28/2023): no stones, cannot see any parotid nodules. No other masses noted. Left parotid slightly larger than right MRI Head w/o (03/28/2023): negative; no stones; no parotid lesions noted  Previously seen by ENT - Scot Jun - asymmetrical SNHL; prednisone given. Not complaining of any otologic symptoms currently.  Neuro notes (Dr. Marjory Lies 07/03/2023): Left jaw pain, headaches. Facial swelling has resolved. Checking labs, supportive care Labs (1030/2024 and 07/03/2023): SSA/SSB - negative; ANA neg; CRP/ESR neg 03/28/2023 CMP and CBC: Glc 345, but otherwise relatively unremarkable; no leukocytosis CT Neck 06/26/2023 reviewed independently and agree with read. Very minimally scattered lymph nodes. No stranding around parotid or facial region; styloids not significantly elongated; no other enhancing lesions noted on my review; some right maxillary caries      Procedures:  None  Impression & Plans:  Larry Jenkins is a 52 y.o. male with no contributory PMHx with:  Left facial pain - Ddx including chronic parotitis v/s musculoskeletal or dental etiology (grinding chewing) Headache, Joint pain, fatigue - Does not fit neatly into any category; his temporalis pain and facial swelling has resolved but now reporting bilateral headaches and temporal pain and posterior head pain as well - neuro exam not revealing and did not think migraines; CT Neck did not show any obvious etiology  either and his symptoms do not seem related to parotitis, especially given resolution of facial swelling. Muscle relaxant did not help, only thing that helped was oxycodone so given other complaints such as fatigue, joint pain, wonder if rheum eval would be helpful.  At this point, given workup, do not think this is parotid or face related No sinonasal symptoms, pain, or disease on CT; mastoids also clear  - Refer to Rheum - Has f/u with neuro - wonder if atypical migraine - given lack of findings despite w/u, will follow upas needed  Thank you for allowing me the opportunity to care for your patient. Please do not hesitate to contact me should you have any other questions.  Sincerely, Jovita Kussmaul, MD Otolarynoglogist (ENT), [facility] Phone: [phone] Fax: [phone]  07/18/2023, 7:49 AM   MDM:  Level 4: 99214 Complexity/Problems addressed: multiple chronic problems - worsening Data complexity: mod - independent lab, CT/imaging review - Morbidity: unclear/low - Prescription Drug prescribed or managed: no

## 2023-12-13 ENCOUNTER — Encounter: Payer: Commercial Managed Care - HMO | Admitting: Internal Medicine

## 2024-01-03 ENCOUNTER — Encounter: Admitting: Internal Medicine
